# Patient Record
Sex: Male | Born: 1950 | Race: White | Hispanic: No | Marital: Married | Smoking: Never smoker
Health system: Southern US, Community
[De-identification: ages and names within clinical notes are randomized; demographics above are authoritative.]

## PROBLEM LIST (undated history)

## (undated) DIAGNOSIS — K219 Gastro-esophageal reflux disease without esophagitis: Secondary | ICD-10-CM

## (undated) DIAGNOSIS — I639 Cerebral infarction, unspecified: Secondary | ICD-10-CM

## (undated) DIAGNOSIS — G459 Transient cerebral ischemic attack, unspecified: Secondary | ICD-10-CM

## (undated) DIAGNOSIS — I10 Essential (primary) hypertension: Secondary | ICD-10-CM

## (undated) DIAGNOSIS — Q211 Atrial septal defect: Secondary | ICD-10-CM

## (undated) DIAGNOSIS — Q2112 Patent foramen ovale: Secondary | ICD-10-CM

## (undated) HISTORY — DX: Patent foramen ovale: Q21.12

## (undated) HISTORY — DX: Essential (primary) hypertension: I10

## (undated) HISTORY — PX: SHOULDER ARTHROSCOPY: SHX128

## (undated) HISTORY — DX: Transient cerebral ischemic attack, unspecified: G45.9

## (undated) HISTORY — DX: Atrial septal defect: Q21.1

---

## 2002-04-29 ENCOUNTER — Encounter: Payer: Self-pay | Admitting: Family Medicine

## 2002-04-29 ENCOUNTER — Encounter: Admission: RE | Admit: 2002-04-29 | Discharge: 2002-04-29 | Payer: Self-pay | Admitting: Family Medicine

## 2004-11-13 ENCOUNTER — Emergency Department (HOSPITAL_COMMUNITY): Admission: EM | Admit: 2004-11-13 | Discharge: 2004-11-13 | Payer: Self-pay | Admitting: Emergency Medicine

## 2005-01-17 ENCOUNTER — Observation Stay (HOSPITAL_COMMUNITY): Admission: AD | Admit: 2005-01-17 | Discharge: 2005-01-18 | Payer: Self-pay | Admitting: *Deleted

## 2005-01-24 ENCOUNTER — Ambulatory Visit (HOSPITAL_COMMUNITY): Admission: RE | Admit: 2005-01-24 | Discharge: 2005-01-24 | Payer: Self-pay | Admitting: *Deleted

## 2005-02-11 ENCOUNTER — Ambulatory Visit (HOSPITAL_COMMUNITY): Admission: RE | Admit: 2005-02-11 | Discharge: 2005-02-12 | Payer: Self-pay | Admitting: Cardiology

## 2005-02-11 HISTORY — PX: PATENT FORAMEN OVALE CLOSURE: SHX2181

## 2005-02-11 HISTORY — PX: TEE WITHOUT CARDIOVERSION: SHX5443

## 2008-03-24 ENCOUNTER — Observation Stay (HOSPITAL_COMMUNITY): Admission: EM | Admit: 2008-03-24 | Discharge: 2008-03-24 | Payer: Self-pay | Admitting: Emergency Medicine

## 2008-04-01 HISTORY — PX: NM MYOCAR PERF WALL MOTION: HXRAD629

## 2010-01-26 ENCOUNTER — Encounter: Admission: RE | Admit: 2010-01-26 | Discharge: 2010-01-26 | Payer: Self-pay | Admitting: Orthopaedic Surgery

## 2010-03-28 ENCOUNTER — Encounter: Payer: Self-pay | Admitting: Orthopaedic Surgery

## 2010-06-21 LAB — LIPID PANEL
HDL: 36 mg/dL — ABNORMAL LOW (ref >39)
LDL Cholesterol: 122 mg/dL — ABNORMAL HIGH (ref 0–99)
Triglycerides: 121 mg/dL (ref <150)

## 2010-06-21 LAB — CBC
HCT: 44.1 % (ref 39.0–52.0)
Hemoglobin: 15.3 g/dL (ref 13.0–17.0)
WBC: 8.8 10*3/uL (ref 4.0–10.5)

## 2010-06-21 LAB — D-DIMER, QUANTITATIVE: D-Dimer, Quant: 0.24 ug/mL-FEU (ref 0.00–0.48)

## 2010-06-21 LAB — DIFFERENTIAL
Eosinophils Relative: 1 % (ref 0–5)
Lymphocytes Relative: 21 % (ref 12–46)
Lymphs Abs: 1.9 10*3/uL (ref 0.7–4.0)
Monocytes Absolute: 0.5 10*3/uL (ref 0.1–1.0)

## 2010-06-21 LAB — POCT CARDIAC MARKERS
CKMB, poc: <1 ng/mL — ABNORMAL LOW (ref 1.0–8.0)
CKMB, poc: <1 ng/mL — ABNORMAL LOW (ref 1.0–8.0)
Myoglobin, poc: 56.3 ng/mL (ref 12–200)
Myoglobin, poc: 63.5 ng/mL (ref 12–200)
Troponin i, poc: <0.05 ng/mL (ref 0.00–0.09)

## 2010-06-21 LAB — CK TOTAL AND CKMB (NOT AT ARMC): Relative Index: 0.6 (ref 0.0–2.5)

## 2010-06-21 LAB — BASIC METABOLIC PANEL
GFR calc Af Amer: >60 mL/min (ref >60)
GFR calc non Af Amer: >60 mL/min (ref >60)
Potassium: 4 mEq/L (ref 3.5–5.1)
Sodium: 137 mEq/L (ref 135–145)

## 2010-06-21 LAB — TROPONIN I: Troponin I: 0.03 ng/mL (ref 0.00–0.06)

## 2010-06-21 LAB — CARDIAC PANEL(CRET KIN+CKTOT+MB+TROPI): Relative Index: 1 (ref 0.0–2.5)

## 2010-07-20 NOTE — Discharge Summary (Signed)
NAMEJERETT, ODONOHUE NO.:  [id number]   MEDICAL RECORD NO.:  [id number]          PATIENT TYPE:  OBV   LOCATION:  3731                         FACILITY:  [facility]   PHYSICIAN:  Madaline Savage, M.D.DATE OF BIRTH:  April 01, 1950   DATE OF ADMISSION:  03/24/2008  DATE OF DISCHARGE:  03/24/2008                               DISCHARGE SUMMARY   Mr. Sean Welch came into the emergency department with complaints  about a fullness and indigestion in his epigastric area with increased  belching that relieved discomfort.  He stated he had that episodes 40-50  times in 1 hour associated with diaphoresis and tingling in bilateral  hands.  No shortness of breath, no nausea or vomiting.  He works in the  [employer].  His blood pressure was 170/80.  The discomfort  persisted, so IV Protonix was given.  He had a similar episode 4 days  ago.  He was admitted and he ruled out for an MI.  He was seen by Dr.  Elsie Lincoln who thought it was all gastrointestinal and he was considered  stable for discharge home to have outpatient Myoview done and also a  referral to Gastroenterology or to see his primary care doctor.  He was  put on Protonix.  He does have a history of large PFO status post  closure in December 2006.  His last Cardiolite was March 22, 2004.   LABORATORY DATA:  Lipid profile, total cholesterol 182, triglycerides  121, HDL 36, and LDL 122.  D-dimer was 0.24.  Sodium 137, potassium 4.0,  BUN 18, creatinine 1.12, and glucose 121.  His hemoglobin 15.3,  hematocrit 44.1, WBCs 8.8, and platelets 250.  Two point-of-care markers  were negative, and two CK-MBs and troponins were negative.  Chest x-ray  showed no acute cardiopulmonary process.  He also had a CT of his head  without contrast, which showed a questionable tiny old lacunar infarct  and left parietal deep white matter.  No acute intracranial  abnormalities.   DISCHARGE DIAGNOSES:  1. Epigastric discomfort.  2.  No known coronary artery disease or positive cardiac risk factors.  3. Mild elevated cholesterol.  4. Hypertension.  5. History of large patent foramen ovale status post closure in      December 2006.  6. Prior history of transient ischemic attack.  7. Recent onychomycosis, finishing a 53-month period of Lamisil.  8. Abnormal EKG.  He has some inferior T-wave inversions which are      old.   His discharge medications are aspirin 81 mg a day, multivitamin every  day, fish oil daily, Lamisil 250 mg daily, and Lotensin 20 mg daily.  He  should start Protonix 40 mg a day.  Our office will call him for an  appointment for a stress test, stress Myoview and then follow up with  Dr. Jacinto Halim and he was directed to go to his primary care doctor or  gastroenterologist.      Lezlie Octave, N.P.    ______________________________  Madaline Savage, M.D.    BB/MEDQ  D:  03/24/2008  T:  03/25/2008  Job:  [id number]   cc:   [facility]

## 2010-07-23 NOTE — Consult Note (Signed)
[name], Welch NO.:  [id number]   MEDICAL RECORD NO.:  [id number]          PATIENT TYPE:  INP   LOCATION:  2024                         FACILITY:  [facility]   PHYSICIAN:  Sean Welch, M.D.  DATE OF BIRTH:  08/29/1950   DATE OF CONSULTATION:  01/17/2005  DATE OF DISCHARGE:  01/18/2005                                   CONSULTATION   HISTORY OF PRESENT ILLNESS:  Sean Welch is a 60 year old right-handed  white male born 1950-09-30, with a history of an event that occurred  on January 15, 2005, while at home.  Patient had an episode of transient  right face, arm and leg numbness that lasted only a couple of minutes with  full resolution.  Patient had no associated headache, vision changes, speech  change with weakness or gait disturbance.  Patient noted that the next  morning he had a little bit of soreness in the right neck and thinks that it  is gone at the time of this admission.  Patient was seen by Dr. Jenne Welch and  was set up for further evaluation.  Patient denies any chest pain,  palpitations.  Patient has not had similar events as above.  Patient denied  any headache with the above event.   PAST MEDICAL HISTORY:  1.  History of transient right sensory deficit, possible TIA.  2.  Right ankle fracture in the past.  3.  Right knee arthroscopic surgery x3.   MEDICATIONS PRIOR TO ADMISSION:  Patient was on no medications prior to  admission.   He does not smoke or drink.   SOCIAL HISTORY:  Patient is married. Lives in the [place], [place], area. He has two children who are alive and well.  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   FAMILY HISTORY:  Mother is alive with history of cancer, MI in the past.  Father is alive and well.  Two sisters are alive and well.   REVIEW OF SYSTEMS:  No recent fever or chills.  Patient denies any problems  with headache, speech problems, swallowing  problems, shortness of breath,  chest pain, cough, abdominal pain, nausea or vomiting, trouble controlling  the bowel or bladder, blackout episodes, vertigo.   PHYSICAL EXAMINATION:  GENERAL APPEARANCE:  This patient is a fairly well-  developed white male who is alert and cooperative at the time of  examination.  VITAL SIGNS:  Blood pressure 135/89, heart rate 83, respiratory rate 20,  temperature afebrile.  HEENT:  Head is atraumatic.  Eyes:  Pupils are equal, round and reactive to  light.  Discs are flat bilaterally.  NECK:  Supple.  No carotid bruits noted.  RESPIRATORY:  Examination is clear.  CARDIOVASCULAR:  Regular rate and rhythm with no obvious murmurs or rubs  noted.  EXTREMITIES:  Without significant edema.  NEUROLOGIC:  Cranial nerves as above.  Facial symmetry is present.  Patient  has good sensation to face with pinprick and soft touch bilaterally.  He has  good strength of facial muscles and muscles to head turning  and shoulder  shrug bilaterally.  Speech is well enunciated and not aphasic.  Motor  testing reveals 5/5 strength in all fours.  Good symmetric motor tone is  noted throughout.  Sensory testing is intact to pinprick, soft touch,  vibratory sensation throughout.  Patient has fair finger-nose-finger, heel-  to-shin, normal tandem gait, normal Romberg, negative no evidence of  pronator drift .  Deep tendon reflexes are symmetric and normal.  Toes are  downgoing bilaterally.   LABORATORY DATA:  Pending at this time.   IMPRESSION:  Transient right-sided numbness, probable transient ischemic  attack event.   This patient is being admitted at this point for evaluation of above  episode.  Patient will require further work-up for transient ischemic attack  event.   PLAN:  1.  Would probably cancel the CT scan of the brain.  2.  MRI scan of the brain in conjunction with MR angiogram with intracranial      and extracranial vessels.  3.  Consider 2-D  echocardiogram .  4.  Aspirin therapy for now.  5.  Check homocystine level, fasting lipid profile.   Will follow patient's clinical course while in house.      Sean Welch, M.D.  Electronically Signed     CKW/MEDQ  D:  01/17/2005  T:  01/18/2005  Job:  [id number]   cc:   Sean Welch. Sean Welch, M.D.  Fax: [phone]

## 2010-07-23 NOTE — Discharge Summary (Signed)
NAMEDEVAUN, HERNANDEZ NO.:  [id number]   MEDICAL RECORD NO.:  [id number]          PATIENT TYPE:  INP   LOCATION:  2024                         FACILITY:  [facility]   PHYSICIAN:  Darlin Priestly, MD  DATE OF BIRTH:  1951-01-27   DATE OF ADMISSION:  01/17/2005  DATE OF DISCHARGE:  01/18/2005                                 DISCHARGE SUMMARY   HISTORY OF THE PRESENT ILLNESS:  Mr. Isham is a 60 year old white married  male patient who came into our office on January 17, 2005 with complaints  of right-sided numbness, some neck and jaw pain, and indigestion.  He was  seen by Dr. Jenne Campus and it was decided that he should be hospitalized to  rule out CVA or/or TIA.   The patient was seen by Dr. Anne Hahn in consultation and an MRI and an MRA  were ordered.  These were both negative.  The patient has been placed  heparin.  On January 18, 2005 he had no symptoms and it was decided that he  should be worked up as an outpatient; thus, he was discharged home to have  follow up tests this week.  He will have a 2-D echo and a Persantine  Cardiolite secondary to his symptoms, and he will have a TEE  as an  outpatient on Monday.  He will then follow up with Dr. Jenne Campus on Tuesday.   LABORATORY DATA:  Hemoglobin 15.2, hematocrit 44.3, platelets 274,000 and  WBC 8.9.  Sodium 140, potassium 3.8. BUN 17 and creatinine 1.3.  CK/MB and  troponins were all negative.  His total cholesterol was 181, his LDL was 95,  HDL was 40 and triglycerides 230; however, this was a nonfasting specimen.   DISCHARGE DIAGNOSES:  1.  Probable transient ischemic attack.  2.  Jaw and neck pain; negative CK/MB and troponins.  3.  Abnormal electrocardiogram with incomplete right bundle branch block and      left anterior fascicular block.  4.  Gastroesophageal reflux disease.  5.  Premature family history of coronary artery disease.   DISCHARGE MEDICATIONS:  1.  Enteric coated aspirin 325 mg once a  day.  2.  Nexium 40 mg once a day.  3.  Toprol XL 25 mg once a day.   FOLLOW UP:  The patient will see Dr. Jenne Campus at 12 P.M. on January 25, 2005.  He will have a 2-D echo at 4 P.M. on January 20, 2005.  He will have  a Cardiolite at 11:15 and he will have a TEE on Monday, January 24, 2005.  He will probably need to see Dr. Anne Hahn again for any further neurological  treatment after his tests are complete.      Lezlie Octave, N.P.      Darlin Priestly, MD  Electronically Signed    BB/MEDQ  D:  01/18/2005  T:  01/19/2005  Job:  [id number]   cc:   Marlan Palau, M.D.  Fax: [phone]   [place]ale Haven. Andrey Campanile, M.D.  Fax: [phone]

## 2010-07-23 NOTE — Cardiovascular Report (Signed)
Sean Welch, Sean Welch             ACCOUNT NO.:  [id number]   MEDICAL RECORD NO.:  [id number]          PATIENT TYPE:  AMB   LOCATION:  ENDO                         FACILITY:  [facility]   PHYSICIAN:  Darlin Priestly, MD  DATE OF BIRTH:  Aug 03, 1950   DATE OF PROCEDURE:  01/24/2005  DATE OF DISCHARGE:                              CARDIAC CATHETERIZATION   PROCEDURE:  Transesophageal echocardiogram.   INDICATIONS:  Mr. Masterson is a 60 year old male with no significant past  medical history who was initially seen in the office on January 17, 2005  after the patient was complaining of some right-sided numbness along his  whole right side of his face, right upper extremity and right lower  extremity, with some temporary loss of function of his right arm.  He was  subsequently admitted to the hospital for rule out TIA which had no  significant findings by head CT and was subsequently seen by neurology, who  felt he had a possible TIA.  He is now referred for transesophageal  echocardiogram to rule out possible PFO.   DESCRIPTION OF OPERATION:  After confirming consent, the patient was brought  to the endoscopy suite in a fasting state.  The patient underwent successful  and uncomplicated transesophageal echocardiogram.   The LV was normal in size and function.  Estimated EF 50-55%.   Structurally normal aortic valve.   Structurally normal mitral valve with mild mitral regurgitation.   Structurally normal tricuspid valve with trivial tricuspid regurgitation.   There was mild pulmonic regurgitation.   There is no evidence of intracardiac mass or thrombus noted.   There is a small PFO noted with right to left, but no left to right shunting  by __________ color and contrast echo.   Normal descending thoracic aorta.   CONCLUSIONS:  Successful transesophageal echocardiogram with findings noted  above.  There does appear to be a small PFO with right to left but no left  to right  shunting by contrast echo.      Darlin Priestly, MD  Electronically Signed     RHM/MEDQ  D:  01/24/2005  T:  01/24/2005  Job:  [id number]   cc:   [place]. Andrey Campanile, M.D.  Fax: [phone]

## 2010-07-23 NOTE — Cardiovascular Report (Signed)
NAMEANTOINNE, SPADACCINI             ACCOUNT NO.:  [id number]   MEDICAL RECORD NO.:  [id number]          PATIENT TYPE:  OIB   LOCATION:  6527                         FACILITY:  [facility]   PHYSICIAN:  Vonna Kotyk R. Jacinto Halim, MD       DATE OF BIRTH:  05/03/1950   DATE OF PROCEDURE:  02/11/2005  DATE OF DISCHARGE:  02/12/2005                              CARDIAC CATHETERIZATION   ATTENDING:  Duffy Rhody C. Andrey Campanile, M.D.   ATTENDING CARDIOLOGIST:  Darlin Priestly, M.D.   NEUROLOGIST:  Marlan Palau, M.D.   PROCEDURE PERFORMED:  1.  Intracardiac echocardiogram.  2.  Sizing of the PFO with PFO sizing balloon.  3.  Closure of the PFO with a 28-mm CardioSEAL septal occluder.   INDICATION:  Mr. Shannen Vernon is a 60 year old Caucasian male with  history of TIA on January 15, 2005 and was found to have a large PFO.  He  underwent a TEE that revealed a large PFO with right to left shunting.  He  underwent transcranial Doppler studies by Dr. 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  As per patient preference, as he has no other etiology  for his TIA and jaundice, he was brought to the catheterization laboratory  for PFO closure.  Other etiologies for his TIA were ruled out.   INTRACARDIAC ECHOCARDIOGRAM DATA:  1.  Left atrium:  The left atrium is normal.  2.  Right atrium:  The right atrium is normal.  3.  Right ventricle:  The right ventricle is normal.  4  Left ventricle:  The left ventricle is normal.  1.  Mitral valve:  The mitral valve is normal.  There is minimal mitral      valve regurgitation.  2.  Tricuspid valve:  The tricuspid valve is normal.  There is mild      tricuspid valve regurgitation.  3.  Aortic valve:  The aortic valve is normal.  4.  Pulmonary artery:  The pulmonary artery is grossly normal.  5.  The root of the aorta was normal.  6.  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  The      PFO was clearly documented by color Doppler  studies.   INTERVENTION DATA:  Successful closure of the PFO with a 28-mm CardioSEAL  septal occluder.  Post closure of the PFO, a double-contrast study  demonstrated no evidence of right to left shunting.  The patient tolerated  the procedure.  No immediate complications were noted.   TECHNIQUE OF THE PROCEDURE:  Under usual sterile precautions using a 9  French left femoral venous access, an intracardiac echo probe was advanced  through the venous access in the left groin, and intracardiac echocardiogram  was performed.  Then after confirming the presence of PFO, a 6 [place]  multipurpose-A2 catheter was advanced into the right atrium, and the PFO was  crossed over with the help of a long Amplatz wire.  Then after positioning  the Amplatz wire in the left upper pulmonary vein, the multipurpose-A2  catheter was withdrawn from the body, and the wire  was kept carefully in  position.  Intravenous heparin was administered during the procedure.  ACT  was maintained at greater that 250.  Then using an 8 French femoral venous  sheath, the PFO sizing balloon was advanced into the PFO.  The PFO size was  measured including the tunnel length. Then the balloon was withdrawn out of  the body.  It was decided to proceed with a 28-mm septal occluder as the  tunnel measured 1 cm and the PFO also measured 1 cm.  Then an 18 French  Mullins sheath was advanced into the left atrium over the Zoar wire and the  left atrial position was confirmed with the intracardiac echo.  Then the  septal occluder was loaded and gently advanced into the left atrial side.  The left atrial side was deployed to appose the interatrial septum, and the  right atrial side was deployed.  The deployment of the device was confirmed  by intracardiac echo.  Post deployment, a double-contrast study was  performed revealing no evidence of right to left shunting.  Then the  catheters were all withdrawn out of the body in the usual  fashion.  The  patient tolerated the procedure.  No immediate complications noted.      Cristy Hilts. Jacinto Halim, MD  Electronically Signed     JRG/MEDQ  D:  02/11/2005  T:  02/12/2005  Job:  [id number]   cc:   [place]. Andrey Campanile, M.D.  Fax: [phone]   Darlin Priestly, MD  Fax: [phone]   C. Lesia Sago, M.D.  Fax: [phone]

## 2011-10-19 HISTORY — PX: US ECHOCARDIOGRAPHY: HXRAD669

## 2012-10-23 ENCOUNTER — Other Ambulatory Visit: Payer: Self-pay | Admitting: *Deleted

## 2012-10-23 MED ORDER — BENAZEPRIL HCL 20 MG PO TABS: 20.0000 mg | ORAL_TABLET | Freq: Every day | ORAL | Status: DC

## 2012-10-31 ENCOUNTER — Telehealth: Payer: Self-pay | Admitting: Cardiovascular Disease

## 2012-10-31 NOTE — Telephone Encounter (Signed)
Pt also verbalized understanding that he will not be able to receive any further refills w/o an appt..  Pt stated he will keep his appt next week.

## 2012-10-31 NOTE — Telephone Encounter (Signed)
Returned call.  Pt informed refill was sent on 8.19.14.  Pt stated he went yesterday and was told they don't have it.  Informed RN will call pharmacy and call him back.  Call to pharmacy and refill received, but pt did not pick up so it was put on hold.    Call to pt and informed.  Pt verbalized understanding and agreed w/ plan.

## 2012-10-31 NOTE — Telephone Encounter (Signed)
Please call-prescription have expired-has an appt on 11-09-12-would like to have enough medicine until his appt.Would you please call his Benazepril 20 mg to [phone].

## 2012-11-04 ENCOUNTER — Encounter: Payer: Self-pay | Admitting: *Deleted

## 2012-11-08 ENCOUNTER — Encounter: Payer: Self-pay | Admitting: Cardiovascular Disease

## 2012-11-09 ENCOUNTER — Encounter: Payer: Self-pay | Admitting: Cardiovascular Disease

## 2012-11-09 ENCOUNTER — Ambulatory Visit (INDEPENDENT_AMBULATORY_CARE_PROVIDER_SITE_OTHER): Payer: PRIVATE HEALTH INSURANCE | Admitting: Cardiovascular Disease

## 2012-11-09 VITALS — BP 100/70 | HR 73 | Ht 70.5 in | Wt 206.6 lb

## 2012-11-09 DIAGNOSIS — Z9889 Other specified postprocedural states: Secondary | ICD-10-CM

## 2012-11-09 DIAGNOSIS — I10 Essential (primary) hypertension: Secondary | ICD-10-CM | POA: Insufficient documentation

## 2012-11-09 DIAGNOSIS — E663 Overweight: Secondary | ICD-10-CM

## 2012-11-09 DIAGNOSIS — Z8774 Personal history of (corrected) congenital malformations of heart and circulatory system: Secondary | ICD-10-CM

## 2012-11-09 MED ORDER — BENAZEPRIL HCL 20 MG PO TABS: 10.0000 mg | ORAL_TABLET | Freq: Every day | ORAL | Status: DC

## 2012-11-09 NOTE — Assessment & Plan Note (Signed)
His blood pressure is borderline low. When he last saw Dr. Thea Silversmith for systolic blood pressure was only 102. Today it is even lower. I've recommended that he reduce the benazepril dose to 10 mg once daily

## 2012-11-09 NOTE — Assessment & Plan Note (Signed)
Marked improvement in his weight. He plans to continue trying to lose weight. He is evidence of a target of 190 pounds. Although this will still leave him in the mildly overweight category, it is important to note that this is exactly what he weighed when he was in ninth grade. He is a fairly muscular and broad shouldered gentleman. I think a weight of 190 pounds might be reasonable.

## 2012-11-09 NOTE — Progress Notes (Signed)
Patient ID: Sean Welch, male   DOB: [birth year], 62 y.o.   MRN: [id number]     Reason for office visit Hypertension, status post ASD closure  Sean Welch just turned 62 years old and states that he feels excellent. He has newfound energy and has lost more than 20 pounds in weight from his maximum weight last year. He weighs 13 pounds less than he did at his office appointment last August. He is eating a low-carb diet and is walking between 2-4 miles a day everyday of the week. He has no cardiac complaints whatsoever. 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 Not had any neurological event since that time. We have been monitoring the occluder on an every other year basis with echocardiography. The study performed last year showed normal findings. He has no cardiac complaints.    No Known Allergies  Current Outpatient Prescriptions  Medication Sig Dispense Refill  . aspirin 81 MG tablet Take 81 mg by mouth daily.      . benazepril (LOTENSIN) 20 MG tablet Take 0.5 tablets (10 mg total) by mouth daily. Needs appointment before anymore refils  90 tablet  3  . Multiple Vitamin (MULTIVITAMIN) tablet Take 1 tablet by mouth daily.      . tamsulosin (FLOMAX) 0.4 MG CAPS capsule Take 0.4 mg by mouth daily.      Marland Kitchen CIALIS 5 MG tablet 5 mg.       No current facility-administered medications for this visit.    Past Medical History  Diagnosis Date  . TIA (transient ischemic attack)   . PFO (patent foramen ovale)     repair 02/2005  . Systemic hypertension     Past Surgical History  Procedure Laterality Date  . Patent foramen ovale closure  02/11/2005  . Tee without cardioversion  02/11/2005  . US echocardiography  10/19/2011    mild concentric LVH,mechanical PFO closure device, MV leaflet valve thickening  . Nm myocar perf wall motion  04/01/2008    Normal    Family History  Problem Relation Age of Onset  . Heart attack  Mother   . Cancer Mother   . Hypertension Mother     History   Social History  . Marital Status: Married    Spouse Name: N/A    Number of Children: N/A  . Years of Education: N/A   Occupational History  . Not on file.   Social History Main Topics  . Smoking status: Never Smoker   . Smokeless tobacco: Not on file  . Alcohol Use: Yes     Comment: couple times a month  . Drug Use: No  . Sexual Activity: Not on file   Other Topics Concern  . Not on file   Social History Narrative  . No narrative on file    Review of systems: The patient specifically denies any chest pain at rest or with exertion, dyspnea at rest or with exertion, orthopnea, paroxysmal nocturnal dyspnea, syncope, palpitations, focal neurological deficits, intermittent claudication, lower extremity edema, unexplained weight gain, cough, hemoptysis or wheezing.  The patient also denies abdominal pain, nausea, vomiting, dysphagia, diarrhea, constipation, polyuria, polydipsia, dysuria, hematuria, frequency, urgency, abnormal bleeding or bruising, fever, chills, unexpected weight changes, mood swings, change in skin or hair texture, change in voice quality, auditory or visual problems, allergic reactions or rashes, new musculoskeletal complaints other than usual "aches and pains".   PHYSICAL EXAM BP  100/70  Pulse 73  Ht 5' 10.5" (1.791 m)  Wt 206 lb 9.6 oz (93.713 kg)  BMI 29.22 kg/m2  General: Alert, oriented x3, no distress Head: no evidence of trauma, PERRL, EOMI, no exophtalmos or lid lag, no myxedema, no xanthelasma; normal ears, nose and oropharynx Neck: normal jugular venous pulsations and no hepatojugular reflux; brisk carotid pulses without delay and no carotid bruits Chest: clear to auscultation, no signs of consolidation by percussion or palpation, normal fremitus, symmetrical and full respiratory excursions Cardiovascular: normal position and quality of the apical impulse, regular rhythm, normal  first and second heart sounds, no murmurs, rubs or gallops Abdomen: no tenderness or distention, no masses by palpation, no abnormal pulsatility or arterial bruits, normal bowel sounds, no hepatosplenomegaly Extremities: no clubbing, cyanosis or edema; 2+ radial, ulnar and brachial pulses bilaterally; 2+ right femoral, posterior tibial and dorsalis pedis pulses; 2+ left femoral, posterior tibial and dorsalis pedis pulses; no subclavian or femoral bruits Neurological: grossly nonfocal   EKG: Normal sinus rhythm, borderline left axis deviation, minor intraventricular conduction delay, no change from previous tracings.  Lipid Panel he states that he recently had laboratory tests including cholesterol levels with Dr. Thea Welch and that all his levels were "excellent".    Component Value Date/Time   CHOL  Value: 182        ATP III CLASSIFICATION:  <200     mg/dL   Desirable  130-865  mg/dL   Borderline High  >=784    mg/dL   High        6/96/2952 0432   TRIG 121 03/24/2008 0432   HDL 36* 03/24/2008 0432   CHOLHDL 5.1 03/24/2008 0432   VLDL 24 03/24/2008 0432   LDLCALC  Value: 122        Total Cholesterol/HDL:CHD Risk Coronary Heart Disease Risk Table                     Men   Women  1/2 Average Risk   3.4   3.3  Average Risk       5.0   4.4  2 X Average Risk   9.6   7.1  3 X Average Risk  23.4   11.0        Use the calculated Patient Ratio above and the CHD Risk Table to determine the patient's CHD Risk.        ATP III CLASSIFICATION (LDL):  <100     mg/dL   Optimal  841-324  mg/dL   Near or Above                    Optimal  130-159  mg/dL   Borderline  401-027  mg/dL   High  >253     mg/dL   Very High* 6/64/4034 0432    BMET    Component Value Date/Time   NA 137 03/24/2008 0028   K 4.0 03/24/2008 0028   CL 106 03/24/2008 0028   CO2 22 03/24/2008 0028   GLUCOSE 121* 03/24/2008 0028   BUN 18 03/24/2008 0028   CREATININE 1.12 03/24/2008 0028   CALCIUM 9.8 03/24/2008 0028   GFRNONAA >60 03/24/2008 0028    GFRAA  Value: >60        The eGFR has been calculated using the MDRD equation. This calculation has not been validated in all clinical situations. eGFR's persistently <60 mL/min signify possible Chronic Kidney Disease. 03/24/2008 0028     ASSESSMENT AND PLAN Overweight  Marked improvement in his weight. He plans to continue trying to lose weight. He is evidence of a target of 190 pounds. Although this will still leave him in the mildly overweight category, it is important to note that this is exactly what he weighed when he was in ninth grade. He is a fairly muscular and broad shouldered gentleman. I think a weight of 190 pounds might be reasonable.  HTN (hypertension) His blood pressure is borderline low. When he last saw Dr. Thea Welch for systolic blood pressure was only 102. Today it is even lower. I've recommended that he reduce the benazepril dose to 10 mg once daily   Orders Placed This Encounter  Procedures  . EKG 12-Lead   Meds ordered this encounter  Medications  . Multiple Vitamin (MULTIVITAMIN) tablet    Sig: Take 1 tablet by mouth daily.  Marland Kitchen CIALIS 5 MG tablet    Sig: 5 mg.  . benazepril (LOTENSIN) 20 MG tablet    Sig: Take 0.5 tablets (10 mg total) by mouth daily. Needs appointment before anymore refils    Dispense:  90 tablet    Refill:  3    Fransheska Willingham  Thurmon Fair, MD, [facility] [phone] office [phone] pager

## 2012-11-09 NOTE — Patient Instructions (Signed)
Your physician has recommended you make the following change in your medication: Reduce Lotensin/benazepril to 10 mg once daily Your physician recommends that you schedule a follow-up appointment in: 1 year

## 2012-11-09 NOTE — Assessment & Plan Note (Signed)
Will reevaluate this next year

## 2012-11-27 ENCOUNTER — Other Ambulatory Visit: Payer: Self-pay | Admitting: Cardiovascular Disease

## 2013-11-20 ENCOUNTER — Ambulatory Visit: Payer: PRIVATE HEALTH INSURANCE | Admitting: Cardiovascular Disease

## 2013-12-23 ENCOUNTER — Ambulatory Visit (INDEPENDENT_AMBULATORY_CARE_PROVIDER_SITE_OTHER): Payer: PRIVATE HEALTH INSURANCE | Admitting: Cardiovascular Disease

## 2013-12-23 ENCOUNTER — Encounter: Payer: Self-pay | Admitting: Cardiovascular Disease

## 2013-12-23 VITALS — BP 110/77 | HR 82 | Resp 16 | Ht 70.0 in | Wt 214.3 lb

## 2013-12-23 DIAGNOSIS — Z8673 Personal history of transient ischemic attack (TIA), and cerebral infarction without residual deficits: Secondary | ICD-10-CM | POA: Insufficient documentation

## 2013-12-23 DIAGNOSIS — I10 Essential (primary) hypertension: Secondary | ICD-10-CM

## 2013-12-23 DIAGNOSIS — Z9889 Other specified postprocedural states: Secondary | ICD-10-CM

## 2013-12-23 DIAGNOSIS — Z8774 Personal history of (corrected) congenital malformations of heart and circulatory system: Secondary | ICD-10-CM

## 2013-12-23 NOTE — Progress Notes (Signed)
Patient ID: Sean Welch, male   DOB: [birth year], 63 y.o.   MRN: [id number]     Reason for office visit Hypertension, history of TIA and PFO closure with percutaneous device  Sean Welch has done well from a cardiac and neurological standpoint since his last appointment a year ago. He has not had any new symptoms to suggest transient ischemic attack or stroke. His PFO was closed with a CardioSEAL device in 2006 and was last evaluated with echocardiography in August 2013. He has well treated hypertension and has struggled with borderline obesity for many years.  Recently he has had even more difficulty maintaining a healthy weight. 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 He has not been exercising at all. He has a weakness for breath in the hospital. He is getting back virtually all the weight that he had lost last year. His body mass index places him in the obese range again.   No Known Allergies  Current Outpatient Prescriptions  Medication Sig Dispense Refill  . aspirin 81 MG tablet Take 81 mg by mouth daily.      . benazepril (LOTENSIN) 20 MG tablet Take 0.5 tablets (10 mg total) by mouth daily. Needs appointment before anymore refils  90 tablet  3  . CIALIS 5 MG tablet 5 mg.      . Multiple Vitamin (MULTIVITAMIN) tablet Take 1 tablet by mouth daily.      . tamsulosin (FLOMAX) 0.4 MG CAPS capsule Take 0.4 mg by mouth daily.       No current facility-administered medications for this visit.    Past Medical History  Diagnosis Date  . TIA (transient ischemic attack)   . PFO (patent foramen ovale)     repair 02/2005  . Systemic hypertension     Past Surgical History  Procedure Laterality Date  . Patent foramen ovale closure  02/11/2005  . Tee without cardioversion  02/11/2005  . US echocardiography  10/19/2011    mild concentric LVH,mechanical PFO closure device, MV leaflet valve thickening  . Nm myocar  perf wall motion  04/01/2008    Normal    Family History  Problem Relation Age of Onset  . Heart attack Mother   . Cancer Mother   . Hypertension Mother     History   Social History  . Marital Status: Married    Spouse Name: N/A    Number of Children: N/A  . Years of Education: N/A   Occupational History  . Not on file.   Social History Main Topics  . Smoking status: Never Smoker   . Smokeless tobacco: Not on file  . Alcohol Use: Yes     Comment: couple times a month  . Drug Use: No  . Sexual Activity: Not on file   Other Topics Concern  . Not on file   Social History Narrative  . No narrative on file    Review of systems: The patient specifically denies any chest pain at rest or with exertion, dyspnea at rest or with exertion, orthopnea, paroxysmal nocturnal dyspnea, syncope, palpitations, focal neurological deficits, intermittent claudication, lower extremity edema, unexplained weight gain, cough, hemoptysis or wheezing.  The patient also denies abdominal pain, nausea, vomiting, dysphagia, diarrhea, constipation, polyuria, polydipsia, dysuria, hematuria, frequency, urgency, abnormal bleeding or bruising, fever, chills, unexpected weight changes, mood swings, change in skin or hair texture, change in voice quality, auditory or visual problems, allergic  reactions or rashes, new musculoskeletal complaints other than usual "aches and pains".   PHYSICAL EXAM BP 110/77  Pulse 82  Resp 16  Ht 5' 10"  (1.778 m)  Wt 97.206 kg (214 lb 4.8 oz)  BMI 30.75 kg/m2  General: Alert, oriented x3, no distress Head: no evidence of trauma, PERRL, EOMI, no exophtalmos or lid lag, no myxedema, no xanthelasma; normal ears, nose and oropharynx Neck: normal jugular venous pulsations and no hepatojugular reflux; brisk carotid pulses without delay and no carotid bruits Chest: clear to auscultation, no signs of consolidation by percussion or palpation, normal fremitus, symmetrical and full  respiratory excursions Cardiovascular: normal position and quality of the apical impulse, regular rhythm, normal first and second heart sounds, no murmurs, rubs or gallops Abdomen: no tenderness or distention, no masses by palpation, no abnormal pulsatility or arterial bruits, normal bowel sounds, no hepatosplenomegaly Extremities: no clubbing, cyanosis or edema; 2+ radial, ulnar and brachial pulses bilaterally; 2+ right femoral, posterior tibial and dorsalis pedis pulses; 2+ left femoral, posterior tibial and dorsalis pedis pulses; no subclavian or femoral bruits Neurological: grossly nonfocal   EKG: NSR, normal   Lipid Panel     Component Value Date/Time   CHOL  Value: 182        ATP III CLASSIFICATION:  <200     mg/dL   Desirable  200-239  mg/dL   Borderline High  >=240    mg/dL   High        03/24/2008 0432   TRIG 121 03/24/2008 0432   HDL 36* 03/24/2008 0432   CHOLHDL 5.1 03/24/2008 0432   VLDL 24 03/24/2008 0432   LDLCALC  Value: 122        Total Cholesterol/HDL:CHD Risk Coronary Heart Disease Risk Table                     Men   Women  1/2 Average Risk   3.4   3.3  Average Risk       5.0   4.4  2 X Average Risk   9.6   7.1  3 X Average Risk  23.4   11.0        Use the calculated Patient Ratio above and the CHD Risk Table to determine the patient's CHD Risk.        ATP III CLASSIFICATION (LDL):  <100     mg/dL   Optimal  100-129  mg/dL   Near or Above                    Optimal  130-159  mg/dL   Borderline  160-189  mg/dL   High  >190     mg/dL   Very High* 03/24/2008 0432    BMET    Component Value Date/Time   NA 137 03/24/2008 0028   K 4.0 03/24/2008 0028   CL 106 03/24/2008 0028   CO2 22 03/24/2008 0028   GLUCOSE 121* 03/24/2008 0028   BUN 18 03/24/2008 0028   CREATININE 1.12 03/24/2008 0028   CALCIUM 9.8 03/24/2008 0028   GFRNONAA >60 03/24/2008 0028   GFRAA  Value: >60        The eGFR has been calculated using the MDRD equation. This calculation has not been validated in all clinical  situations. eGFR's persistently <60 mL/min signify possible Chronic Kidney Disease. 03/24/2008 0028     ASSESSMENT AND PLAN Obesity He expresses renewed interest in weight loss. He promises to lose 20 pounds.  I told him I be satisfied if he reduces his waistline 34 inches. He is a fairly muscular and broad shouldered gentleman. I think a weight of 190 pounds might be reasonable.  HTN (hypertension)  Good blood pressure control History of TIA and status post PFO closure with a CardioSEAL device Reevaluate echocardiogram with saline contrast next year.  Holli Humbles, MD, [place] [phone] office [phone] pager

## 2013-12-23 NOTE — Patient Instructions (Signed)
Your physician encouraged you to lose weight for better health.  Dr. Sallyanne Kuster recommends that you schedule a follow-up appointment in: One year.

## 2014-02-02 ENCOUNTER — Other Ambulatory Visit: Payer: Self-pay | Admitting: Cardiovascular Disease

## 2014-02-03 NOTE — Telephone Encounter (Signed)
Rx was sent to pharmacy electronically. 

## 2014-09-10 ENCOUNTER — Other Ambulatory Visit: Payer: Self-pay | Admitting: Orthopaedic Surgery

## 2014-09-10 DIAGNOSIS — M25521 Pain in right elbow: Secondary | ICD-10-CM

## 2014-09-29 ENCOUNTER — Ambulatory Visit
Admission: RE | Admit: 2014-09-29 | Discharge: 2014-09-29 | Disposition: A | Discharge status: Home / Self Care | Payer: PRIVATE HEALTH INSURANCE | Source: Ambulatory Visit | Attending: Orthopaedic Surgery | Admitting: Orthopaedic Surgery

## 2014-09-29 DIAGNOSIS — M25521 Pain in right elbow: Secondary | ICD-10-CM

## 2014-12-31 ENCOUNTER — Encounter: Payer: Self-pay | Admitting: Cardiovascular Disease

## 2014-12-31 ENCOUNTER — Ambulatory Visit (INDEPENDENT_AMBULATORY_CARE_PROVIDER_SITE_OTHER): Payer: PRIVATE HEALTH INSURANCE | Admitting: Cardiovascular Disease

## 2014-12-31 VITALS — BP 130/72 | HR 80 | Resp 16 | Ht 70.5 in | Wt 207.4 lb

## 2014-12-31 DIAGNOSIS — I10 Essential (primary) hypertension: Secondary | ICD-10-CM | POA: Diagnosis not present

## 2014-12-31 DIAGNOSIS — Z9889 Other specified postprocedural states: Secondary | ICD-10-CM | POA: Diagnosis not present

## 2014-12-31 DIAGNOSIS — Z8774 Personal history of (corrected) congenital malformations of heart and circulatory system: Secondary | ICD-10-CM | POA: Diagnosis not present

## 2014-12-31 DIAGNOSIS — Z8673 Personal history of transient ischemic attack (TIA), and cerebral infarction without residual deficits: Secondary | ICD-10-CM | POA: Diagnosis not present

## 2014-12-31 NOTE — Progress Notes (Signed)
Patient ID: Sean Welch, male   DOB: 1950/12/04, 64 y.o.   MRN: [id number]      Cardiology Office Note   Date:  12/31/2014   ID:  AADAM ZHEN, DOB 1951/02/01, MRN [id number]  PCP:  Thressa Sheller, MD  Cardiologist:   Sanda Klein, MD   Chief Complaint  Patient presents with  . Annual Exam    No complaints      History of Present Illness: ABRAN GAVIGAN is a 64 y.o. male who presents for  Follow-up for essential hypertension, obesity and history of transient ischemic attack status post device closure of a small atrial septal defect/patent foramen ovale. (2006 72mm CardioSeal)  He has had a good year from a cardiovascular standpoint , although activity level was limited following a rupture of his right biceps which has healed slowly. He denies new neurological events and does not have edema, shortness of breath, angina , intermittent claudication or other cardiovascular complaints. He is compliant with his medications.   He has successfully lost a little bit of weight and is no longer formally in the obese range, although he remains severely overweight. He intends to continue losing weight.    Past Medical History  Diagnosis Date  . TIA (transient ischemic attack)   . PFO (patent foramen ovale)     repair 02/2005  . Systemic hypertension     Past Surgical History  Procedure Laterality Date  . Patent foramen ovale closure  02/11/2005  . Tee without cardioversion  02/11/2005  . US echocardiography  10/19/2011    mild concentric LVH,mechanical PFO closure device, MV leaflet valve thickening  . Nm myocar perf wall motion  04/01/2008    Normal     Current Outpatient Prescriptions  Medication Sig Dispense Refill  . aspirin 81 MG tablet Take 81 mg by mouth daily.    . benazepril (LOTENSIN) 20 MG tablet Take 0.5 tablets (10 mg total) by mouth daily. 90 tablet 1  . CIALIS 5 MG tablet 5 mg.    . Multiple Vitamin (MULTIVITAMIN) tablet Take 1 tablet by mouth daily.      . tamsulosin (FLOMAX) 0.4 MG CAPS capsule Take 0.4 mg by mouth daily.     No current facility-administered medications for this visit.    Allergies:   Review of patient's allergies indicates no known allergies.    Social History:  The patient  reports that he has never smoked. He does not have any smokeless tobacco history on file. He reports that he drinks alcohol. He reports that he does not use illicit drugs.   Family History:  The patient's family history includes Cancer in his mother; Heart attack in his mother; Hypertension in his mother.    ROS:  Please see the history of present illness.    Otherwise, review of systems positive for none.   All other systems are reviewed and negative.    PHYSICAL EXAM: VS:  BP 130/72 mmHg  Pulse 80  Resp 16  Ht 5' 10.5" (1.791 m)  Wt 207 lb 6.4 oz (94.076 kg)  BMI 29.33 kg/m2 , BMI Body mass index is 29.33 kg/(m^2).  General: Alert, oriented x3, no distress Head: no evidence of trauma, PERRL, EOMI, no exophtalmos or lid lag, no myxedema, no xanthelasma; normal ears, nose and oropharynx Neck: normal jugular venous pulsations and no hepatojugular reflux; brisk carotid pulses without delay and no carotid bruits Chest: clear to auscultation, no signs of consolidation by percussion or palpation, normal fremitus, symmetrical and full  respiratory excursions Cardiovascular: normal position and quality of the apical impulse, regular rhythm, normal first and second heart sounds, no \ murmurs, rubs or gallops Abdomen: no tenderness or distention, no masses by palpation, no abnormal pulsatility or arterial bruits, normal bowel sounds, no hepatosplenomegaly Extremities: no clubbing, cyanosis or edema; 2+ radial, ulnar and brachial pulses bilaterally; 2+ right femoral, posterior tibial and dorsalis pedis pulses; 2+ left femoral, posterior tibial and dorsalis pedis pulses; no subclavian or femoral bruits Neurological: grossly nonfocal Psych: euthymic  mood, full affect   EKG:  EKG is ordered today. 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    Lipid Panel    Component Value Date/Time   CHOL  03/24/2008 0432    182        ATP III CLASSIFICATION:  <200     mg/dL   Desirable  200-239  mg/dL   Borderline High  >=240    mg/dL   High          TRIG 121 03/24/2008 0432   HDL 36* 03/24/2008 0432   CHOLHDL 5.1 03/24/2008 0432   VLDL 24 03/24/2008 0432   LDLCALC * 03/24/2008 0432    122        Total Cholesterol/HDL:CHD Risk Coronary Heart Disease Risk Table                     Men   Women  1/2 Average Risk   3.4   3.3  Average Risk       5.0   4.4  2 X Average Risk   9.6   7.1  3 X Average Risk  23.4   11.0        Use the calculated Patient Ratio above and the CHD Risk Table to determine the patient's CHD Risk.        ATP III CLASSIFICATION (LDL):  <100     mg/dL   Optimal  100-129  mg/dL   Near or Above                    Optimal  130-159  mg/dL   Borderline  160-189  mg/dL   High  >190     mg/dL   Very High      Wt Readings from Last 3 Encounters:  12/31/14 207 lb 6.4 oz (94.076 kg)  12/23/13 214 lb 4.8 oz (97.206 kg)  11/09/12 206 lb 9.6 oz (93.713 kg)     .   ASSESSMENT AND PLAN:  1. S/P ASD occluder for TIA -  No evidence of device malfunction. Repeat echo assessment with a "bubble study" next year. 2.  Essential hypertension, well controlled 3.  Overweight, slowly losing weight.  Encourage persistence  Towards this goal 4. LAFB    Current medicines are reviewed at length with the patient today.  The patient does not have concerns regarding medicines.  The following changes have been made:  no change  Labs/ tests ordered today include:  Orders Placed This Encounter  Procedures  . EKG 12-Lead   Patient Instructions  Dr. Sallyanne Kuster recommends that you schedule a  follow-up appointment in: ONE YEAR       SignedSanda Klein, MD  12/31/2014 3:32 PM    Sanda Klein, MD, [facility] [phone] office [phone] pager

## 2014-12-31 NOTE — Patient Instructions (Signed)
Dr. Croitoru recommends that you schedule a follow-up appointment in: ONE YEAR   

## 2015-02-01 ENCOUNTER — Other Ambulatory Visit: Payer: Self-pay | Admitting: Cardiovascular Disease

## 2015-02-02 NOTE — Telephone Encounter (Signed)
Rx(s) sent to pharmacy electronically.  

## 2015-12-31 ENCOUNTER — Ambulatory Visit: Payer: PRIVATE HEALTH INSURANCE | Admitting: Cardiovascular Disease

## 2016-02-01 ENCOUNTER — Encounter: Payer: Self-pay | Admitting: *Deleted

## 2016-02-03 ENCOUNTER — Ambulatory Visit (INDEPENDENT_AMBULATORY_CARE_PROVIDER_SITE_OTHER): Payer: Commercial Managed Care - PPO | Admitting: Cardiovascular Disease

## 2016-02-03 ENCOUNTER — Encounter: Payer: Self-pay | Admitting: Cardiovascular Disease

## 2016-02-03 VITALS — BP 120/70 | HR 85 | Ht 70.5 in | Wt 212.6 lb

## 2016-02-03 DIAGNOSIS — Z9889 Other specified postprocedural states: Secondary | ICD-10-CM

## 2016-02-03 DIAGNOSIS — I10 Essential (primary) hypertension: Secondary | ICD-10-CM

## 2016-02-03 DIAGNOSIS — E663 Overweight: Secondary | ICD-10-CM

## 2016-02-03 DIAGNOSIS — Z8774 Personal history of (corrected) congenital malformations of heart and circulatory system: Secondary | ICD-10-CM

## 2016-02-03 NOTE — Progress Notes (Signed)
Cardiology Office Note    Date:  02/03/2016   ID:  Sean Welch, DOB Feb 04, 1951, MRN [id number]  PCP:  Sean Sheller, MD  Cardiologist:   Sanda Klein, MD   No chief complaint on file.   History of Present Illness:  Sean Welch is a 65 y.o. 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 His wife Sean Welch is also my patient.  From a physical standpoint he has done great since his last appointment. He had lost down to 196 pounds and was  able to fit into 34 inch jeans, but recently he has not been able to pay attention to his diet or exercise. His waistline is back up to 36 inches. His most important employee, who was worked for him for 20 years has had a stroke and he has had to pick up the additional workload. He has gained back special weight so that he now weighs 5 pounds more than he did a year ago. He has not had problems with dyspnea, angina, palpitations, neurological complaints, leg edema or claudication. His blood pressure remains well controlled.  Past Medical History:  Diagnosis Date  . PFO (patent foramen ovale)    repair 02/2005  . Systemic hypertension   . TIA (transient ischemic attack)     Past Surgical History:  Procedure Laterality Date  . NM MYOCAR PERF WALL MOTION  04/01/2008   Normal  . PATENT FORAMEN OVALE CLOSURE  02/11/2005  . TEE WITHOUT CARDIOVERSION  02/11/2005  . US ECHOCARDIOGRAPHY  10/19/2011   mild concentric LVH,mechanical PFO closure device, MV leaflet valve thickening    Current Medications: Outpatient Medications Prior to Visit  Medication Sig Dispense Refill  . aspirin 81 MG tablet Take 81 mg by mouth daily.    . benazepril (LOTENSIN) 20 MG tablet TAKE 0.5 TABLETS (10 MG TOTAL) BY MOUTH DAILY. 90 tablet 2  . Multiple Vitamin (MULTIVITAMIN) tablet Take 1 tablet by mouth daily.    . tamsulosin  (FLOMAX) 0.4 MG CAPS capsule Take 0.4 mg by mouth daily.    Marland Kitchen CIALIS 5 MG tablet 5 mg.     No facility-administered medications prior to visit.      Allergies:   Patient has no known allergies.   Social History   Social History  . Marital status: Married    Spouse name: N/A  . Number of children: N/A  . Years of education: N/A   Social History Main Topics  . Smoking status: Never Smoker  . Smokeless tobacco: Never Used  . Alcohol use Yes     Comment: couple times a month  . Drug use: No  . Sexual activity: Not Asked   Other Topics Concern  . None   Social History Narrative  . None     Family History:  The patient's family history includes Cancer in his mother; Healthy in his child, child, father, sister, and sister; Heart attack in his mother; Hypertension in his mother.   ROS:   Please see the history of present illness.    ROS All other systems reviewed and are negative.   PHYSICAL EXAM:   VS:  BP 120/70 (BP Location: Right Arm, Patient Position: Sitting, Cuff Size: Normal)   Pulse 85   Ht 5' 10.5" (1.791 m)   Wt 212 lb 9.6 oz (96.4 kg)   BMI 30.07 kg/m    GEN: Well nourished,  well developed, in no acute distress  HEENT: normal  Neck: no JVD, carotid bruits, or masses Cardiac: RRR; no murmurs, rubs, or gallops,no edema  Respiratory:  clear to auscultation bilaterally, normal work of breathing GI: soft, nontender, nondistended, + BS MS: no deformity or atrophy  Skin: warm and dry, no rash Neuro:  Alert and Oriented x 3, Strength and sensation are intact Psych: euthymic mood, full affect  Wt Readings from Last 3 Encounters:  02/03/16 212 lb 9.6 oz (96.4 kg)  12/31/14 207 lb 6.4 oz (94.1 kg)  12/23/13 214 lb 4.8 oz (97.2 kg)      Studies/Labs Reviewed:   EKG:  EKG is ordered today.  The ekg ordered today demonstrates Normal sinus rhythm, left anterior fascicular block, QTC 428 ms, no repolarization changes  Recent Labs: No results found for requested  labs within last 8760 hours.   Lipid Panel    Component Value Date/Time   CHOL  03/24/2008 0432    182        ATP III CLASSIFICATION:  <200     mg/dL   Desirable  200-239  mg/dL   Borderline High  >=240    mg/dL   High          TRIG 121 03/24/2008 0432   HDL 36 (L) 03/24/2008 0432   CHOLHDL 5.1 03/24/2008 0432   VLDL 24 03/24/2008 0432   LDLCALC (H) 03/24/2008 0432    122        Total Cholesterol/HDL:CHD Risk Coronary Heart Disease Risk Table                     Men   Women  1/2 Average Risk   3.4   3.3  Average Risk       5.0   4.4  2 X Average Risk   9.6   7.1  3 X Average Risk  23.4   11.0        Use the calculated Patient Ratio above and the CHD Risk Table to determine the patient's CHD Risk.        ATP III CLASSIFICATION (LDL):  <100     mg/dL   Optimal  100-129  mg/dL   Near or Above                    Optimal  130-159  mg/dL   Borderline  160-189  mg/dL   High  >190     mg/dL   Very High     ASSESSMENT:    1. Essential hypertension   2. Overweight   3. Status post device closure of ASD      PLAN:  In order of problems listed above:  1. HTN: Well-controlled despite weight gain 2. Obesity: Again reviewed the importance of caloric restriction and regular exercise to lose weight. 3. ASD closure: Asymptomatic, no abnormalities on physical exam. Had planned reevaluation with an echocardiogram this year, but due to his increased work load we'll delay until next visit.    Medication Adjustments/Labs and Tests Ordered: Current medicines are reviewed at length with the patient today.  Concerns regarding medicines are outlined above.  Medication changes, Labs and Tests ordered today are listed in the Patient Instructions below. Patient Instructions  Dr Sallyanne Kuster recommends that you schedule a follow-up appointment in 12 months. You will receive a reminder letter in the mail two months in advance. If you don't receive a letter, please call our office to schedule  the follow-up appointment.  If you need a refill on your cardiac medications before your next appointment, please call your pharmacy.    Signed, Sanda Klein, MD  02/03/2016 4:18 PM    [facility] [address], [place], [place]  [address] Phone: [phone]; Fax: [phone]

## 2016-02-03 NOTE — Patient Instructions (Signed)
Dr Croitoru recommends that you schedule a follow-up appointment in 12 months. You will receive a reminder letter in the mail two months in advance. If you don't receive a letter, please call our office to schedule the follow-up appointment.  If you need a refill on your cardiac medications before your next appointment, please call your pharmacy. 

## 2016-04-30 ENCOUNTER — Other Ambulatory Visit: Payer: Self-pay | Admitting: Cardiovascular Disease

## 2017-03-06 DIAGNOSIS — G459 Transient cerebral ischemic attack, unspecified: Secondary | ICD-10-CM | POA: Insufficient documentation

## 2017-03-06 DIAGNOSIS — I10 Essential (primary) hypertension: Secondary | ICD-10-CM | POA: Insufficient documentation

## 2017-03-06 DIAGNOSIS — Q211 Atrial septal defect: Secondary | ICD-10-CM | POA: Insufficient documentation

## 2017-03-06 DIAGNOSIS — Q2112 Patent foramen ovale: Secondary | ICD-10-CM | POA: Insufficient documentation

## 2017-03-30 ENCOUNTER — Ambulatory Visit: Payer: Commercial Managed Care - PPO | Admitting: Cardiovascular Disease

## 2017-03-30 ENCOUNTER — Encounter: Payer: Self-pay | Admitting: Cardiovascular Disease

## 2017-03-30 VITALS — BP 118/80 | HR 84 | Ht 70.5 in | Wt 207.8 lb

## 2017-03-30 DIAGNOSIS — Z8774 Personal history of (corrected) congenital malformations of heart and circulatory system: Secondary | ICD-10-CM

## 2017-03-30 DIAGNOSIS — I10 Essential (primary) hypertension: Secondary | ICD-10-CM | POA: Diagnosis not present

## 2017-03-30 DIAGNOSIS — E663 Overweight: Secondary | ICD-10-CM

## 2017-03-30 DIAGNOSIS — I444 Left anterior fascicular block: Secondary | ICD-10-CM | POA: Diagnosis not present

## 2017-03-30 NOTE — Patient Instructions (Signed)
Dr Croitoru recommends that you schedule a follow-up appointment in 12 months. You will receive a reminder letter in the mail two months in advance. If you don't receive a letter, please call our office to schedule the follow-up appointment.  If you need a refill on your cardiac medications before your next appointment, please call your pharmacy. 

## 2017-03-30 NOTE — Progress Notes (Signed)
Cardiology Office Note    Date:  03/31/2017   ID:  Sean Welch, DOB [birth year], MRN [id number]  PCP:  Sean Sheller, MD  Cardiologist:   Sean Klein, MD   Chief Complaint  Patient presents with  . Follow-up    pt reports no complaints    History of Present Illness:  Sean Welch is a 67 y.o. 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 His wife Sean Welch is also my patient.  This is a routine visit for him.  He has had a very busy fall and winter since he is trying to move his aging parents from [place] to [place].  He has paid less attention to diet and exercise.  He has gained back about 10 pounds of weight since last October.  He is still walking about 4 days a week, 3 miles a day.  Last October when he was working hard on losing weight but was also eating a lot of beef jerky snacks he developed a gout for the first time.  He is now taking allopurinol.  He has not had any gout in the last couple of months.  The patient specifically denies any chest pain at rest exertion, dyspnea at rest or with exertion, orthopnea, paroxysmal nocturnal dyspnea, syncope, palpitations, focal neurological deficits, intermittent claudication, lower extremity edema, unexplained weight gain, cough, hemoptysis or wheezing.   Past Medical History:  Diagnosis Date  . PFO (patent foramen ovale)    repair 02/2005  . Systemic hypertension   . TIA (transient ischemic attack)     Past Surgical History:  Procedure Laterality Date  . NM MYOCAR PERF WALL MOTION  04/01/2008   Normal  . PATENT FORAMEN OVALE CLOSURE  02/11/2005  . TEE WITHOUT CARDIOVERSION  02/11/2005  . US ECHOCARDIOGRAPHY  10/19/2011   mild concentric LVH,mechanical PFO closure device, MV leaflet valve thickening    Current Medications: Outpatient Medications Prior to Visit    Medication Sig Dispense Refill  . allopurinol (ZYLOPRIM) 100 MG tablet Take 100 mg by mouth daily.  0  . aspirin 81 MG tablet Take 81 mg by mouth daily.    . benazepril (LOTENSIN) 20 MG tablet TAKE 0.5 TABLETS (10 MG TOTAL) BY MOUTH DAILY. 90 tablet 3  . colchicine 0.6 MG tablet Take 0.6 mg by mouth daily as needed.  2  . Multiple Vitamin (MULTIVITAMIN) tablet Take 1 tablet by mouth daily.    . tadalafil (CIALIS) 5 MG tablet Take 5 mg by mouth daily.  1  . tamsulosin (FLOMAX) 0.4 MG CAPS capsule Take 0.4 mg by mouth daily.     No facility-administered medications prior to visit.      Allergies:   Patient has no known allergies.   Social History   Socioeconomic History  . Marital status: Married    Spouse name: None  . Number of children: None  . Years of education: None  . Highest education level: None  Social Needs  . Financial resource strain: None  . Food insecurity - worry: None  . Food insecurity - inability: None  . Transportation needs - medical: None  . Transportation needs - non-medical: None  Occupational History  . None  Tobacco Use  . Smoking status: Never Smoker  . Smokeless tobacco: Never Used  Substance and Sexual Activity  . Alcohol use: Yes    Comment: couple times a  month  . Drug use: No  . Sexual activity: None  Other Topics Concern  . None  Social History Narrative  . None     Family History:  The patient's family history includes Cancer in his mother; Healthy in his child, child, father, sister, and sister; Heart attack in his mother; Hypertension in his mother.   ROS:   Please see the history of present illness.    ROS All other systems reviewed and are negative.   PHYSICAL EXAM:   VS:  BP 118/80   Pulse 84   Ht 5' 10.5" (1.791 m)   Wt 207 lb 12.8 oz (94.3 kg)   BMI 29.39 kg/m     General: Alert, oriented x3, no distress, he is overweight but also quite muscular.  Overall he appears fit Head: no evidence of trauma, PERRL, EOMI, no  exophtalmos or lid lag, no myxedema, no xanthelasma; normal ears, nose and oropharynx Neck: normal jugular venous pulsations and no hepatojugular reflux; brisk carotid pulses without delay and no carotid bruits Chest: clear to auscultation, no signs of consolidation by percussion or palpation, normal fremitus, symmetrical and full respiratory excursions Cardiovascular: normal position and quality of the apical impulse, regular rhythm, normal first and second heart sounds, no murmurs, rubs or gallops Abdomen: no tenderness or distention, no masses by palpation, no abnormal pulsatility or arterial bruits, normal bowel sounds, no hepatosplenomegaly Extremities: no clubbing, cyanosis or edema; 2+ radial, ulnar and brachial pulses bilaterally; 2+ right femoral, posterior tibial and dorsalis pedis pulses; 2+ left femoral, posterior tibial and dorsalis pedis pulses; no subclavian or femoral bruits Neurological: grossly nonfocal Psych: Normal mood and affect   Wt Readings from Last 3 Encounters:  03/30/17 207 lb 12.8 oz (94.3 kg)  02/03/16 212 lb 9.6 oz (96.4 kg)  12/31/14 207 lb 6.4 oz (94.1 kg)      Studies/Labs Reviewed:   EKG:  EKG is ordered today.  The ekg ordered today demonstrates NSR, LAFB Recent Labs: No results found for requested labs within last 8760 hours.   Lipid Panel    Component Value Date/Time   CHOL  03/24/2008 0432    182        ATP III CLASSIFICATION:  <200     mg/dL   Desirable  200-239  mg/dL   Borderline High  >=240    mg/dL   High          TRIG 121 03/24/2008 0432   HDL 36 (L) 03/24/2008 0432   CHOLHDL 5.1 03/24/2008 0432   VLDL 24 03/24/2008 0432   LDLCALC (H) 03/24/2008 0432    122        Total Cholesterol/HDL:CHD Risk Coronary Heart Disease Risk Table                     Men   Women  1/2 Average Risk   3.4   3.3  Average Risk       5.0   4.4  2 X Average Risk   9.6   7.1  3 X Average Risk  23.4   11.0        Use the calculated Patient Ratio above  and the CHD Risk Table to determine the patient's CHD Risk.        ATP III CLASSIFICATION (LDL):  <100     mg/dL   Optimal  100-129  mg/dL   Near or Above  Optimal  130-159  mg/dL   Borderline  160-189  mg/dL   High  >190     mg/dL   Very High     ASSESSMENT:    1. Essential hypertension   2. Overweight   3. Status post device closure of ASD      PLAN:  In order of problems listed above:  1. HTN: Well-controlled on current regimen. 2. Overweight: Encouraged him to pay more attention to his diet.  Avoid products rich in animal meat.  Drink plenty of fluids when he is actively trying to lose weight, to avoid another gout attack. 3. ASD closure: Asymptomatic, no abnormalities on physical exam.  We had planned to reevaluate with an echo on this visit, but since he is so busy with his parents will put her up for later in the year.    Medication Adjustments/Labs and Tests Ordered: Current medicines are reviewed at length with the patient today.  Concerns regarding medicines are outlined above.  Medication changes, Labs and Tests ordered today are listed in the Patient Instructions below. Patient Instructions  Dr Sallyanne Kuster recommends that you schedule a follow-up appointment in 12 months. You will receive a reminder letter in the mail two months in advance. If you don't receive a letter, please call our office to schedule the follow-up appointment.  If you need a refill on your cardiac medications before your next appointment, please call your pharmacy.`    Signed, Sean Klein, MD  03/31/2017 5:41 PM    [facility] [address], [place], [place]  [address] Phone: [phone]; Fax: [phone]

## 2017-03-31 ENCOUNTER — Encounter: Payer: Self-pay | Admitting: Cardiovascular Disease

## 2017-07-27 ENCOUNTER — Other Ambulatory Visit: Payer: Self-pay | Admitting: Cardiovascular Disease

## 2017-10-28 ENCOUNTER — Other Ambulatory Visit: Payer: Self-pay | Admitting: Cardiovascular Disease

## 2017-12-05 ENCOUNTER — Telehealth (INDEPENDENT_AMBULATORY_CARE_PROVIDER_SITE_OTHER): Payer: Self-pay | Admitting: Orthopaedic Surgery

## 2017-12-05 NOTE — Telephone Encounter (Signed)
Patient called stating "he rolled his left ankle" last Thursday 11/30/17.  Patient was given the number for the [facility] office to be seen tomorrow.  Patient requested a return call to let him know if there was anything he could do until he is seen.

## 2017-12-05 NOTE — Telephone Encounter (Signed)
Ice,crutches if necessary=if I get to the office will see this pm

## 2017-12-05 NOTE — Telephone Encounter (Signed)
Please advise 

## 2017-12-05 NOTE — Telephone Encounter (Signed)
I called patient, patient going to see Dr. Lorre Nick 12/05/17 at 1:30

## 2018-02-21 ENCOUNTER — Ambulatory Visit (INDEPENDENT_AMBULATORY_CARE_PROVIDER_SITE_OTHER): Payer: Self-pay | Admitting: Orthopaedic Surgery

## 2018-02-21 ENCOUNTER — Ambulatory Visit (INDEPENDENT_AMBULATORY_CARE_PROVIDER_SITE_OTHER): Payer: Self-pay

## 2018-02-21 ENCOUNTER — Ambulatory Visit (INDEPENDENT_AMBULATORY_CARE_PROVIDER_SITE_OTHER): Payer: Commercial Managed Care - PPO | Admitting: Orthopaedic Surgery

## 2018-02-21 ENCOUNTER — Encounter (INDEPENDENT_AMBULATORY_CARE_PROVIDER_SITE_OTHER): Payer: Self-pay | Admitting: Orthopaedic Surgery

## 2018-02-21 VITALS — BP 115/85 | HR 81 | Resp 14 | Ht 70.5 in | Wt 213.0 lb

## 2018-02-21 DIAGNOSIS — M25522 Pain in left elbow: Secondary | ICD-10-CM

## 2018-02-21 MED ORDER — LIDOCAINE HCL 1 % IJ SOLN
1.0000 mL | INTRAMUSCULAR | Status: AC | PRN
  Administered 2018-02-21: 1 mL

## 2018-02-21 MED ORDER — METHYLPREDNISOLONE ACETATE 40 MG/ML IJ SUSP
40.0000 mg | INTRAMUSCULAR | Status: AC | PRN
  Administered 2018-02-21: 40 mg

## 2018-02-21 NOTE — Progress Notes (Signed)
Office Visit Note   Patient: Sean Welch           Date of Birth: 07-29-1950           MRN: [id number] Visit Date: 02/21/2018              Requested by: Thressa Sheller, [place], [address] [place], [place] [address] PCP: Thressa Sheller, MD   Assessment & Plan: Visit Diagnoses:  1. Pain in left elbow     Plan: Acute medial epicondylitis left elbow.  Will inject with cortisone and monitor response  Follow-Up Instructions: Return if symptoms worsen or fail to improve.   Orders:  Orders Placed This Encounter  Procedures  . Hand/UE Inj: L elbow  . XR Elbow 2 Views Left   No orders of the defined types were placed in this encounter.     Procedures: Hand/UE Inj: L elbow for medial epicondylitis on 02/21/2018 8:27 AM Medications: 1 mL lidocaine 1 %; 40 mg methylPREDNISolone acetate 40 MG/ML      Clinical Data: No additional findings.   Subjective: Chief Complaint  Patient presents with  . Left Elbow - Pain  . Elbow Pain    Slipped x 3 weeks, limited range of motion, weakness, shooting pain, tenderness  Sonia Side 67 years old and injured his left elbow about 3 weeks ago.  He was getting out of his truck when he slipped and grabbed the truck with his left arm.  He felt something "pop" along the medial aspect of his elbow.  He was hoping that it would simply resolve on its own but he continues to have pain to the point of compromise.  He is having trouble sleeping and gripping objects.  The pain is localized on the medial epicondyles.  No numbness or tingling.  No skin changes or ecchymosis  HPI  Review of Systems  Constitutional: Negative for fatigue.  HENT: Negative for trouble swallowing.   Eyes: Negative for pain.  Respiratory: Negative for shortness of breath.   Cardiovascular: Negative for leg swelling.  Gastrointestinal: Negative for constipation.  Endocrine: Negative for cold intolerance.  Genitourinary: Negative for difficulty urinating.    Musculoskeletal: Negative for joint swelling.  Skin: Negative for rash.  Allergic/Immunologic: Negative for food allergies.  Neurological: Positive for weakness. Negative for numbness.  Hematological: Does not bruise/bleed easily.  Psychiatric/Behavioral: Negative for sleep disturbance.     Objective: Vital Signs: BP 115/85 (BP Location: Right Arm, Patient Position: Sitting, Cuff Size: Normal)   Pulse 81   Resp 14   Ht 5' 10.5" (1.791 m)   Wt 213 lb (96.6 kg)   BMI 30.13 kg/m   Physical Exam Constitutional:      Appearance: He is well-developed.  Eyes:     Pupils: Pupils are equal, round, and reactive to light.  Pulmonary:     Effort: Pulmonary effort is normal.  Skin:    General: Skin is warm and dry.  Neurological:     Mental Status: He is alert and oriented to person, place, and time.  Psychiatric:        Behavior: Behavior normal.     Ortho Exam awake alert and oriented x3.  Comfortable sitting.  Pain is localized to the medial epicondyles of the left elbow.  There might be slight swelling but no ecchymosis or erythema.  No pain over the ulnar nerve.  Neurovascular exam intact.  No pain over the triceps or biceps or laterally  Specialty Comments:  No specialty  comments available.  Imaging: Xr Elbow 2 Views Left  Result Date: 02/21/2018 2 views of the left elbow were obtained.  No acute changes.  No fat pad.  No ectopic calcification.    PMFS History: Patient Active Problem List   Diagnosis Date Noted  . Systemic hypertension   . PFO (patent foramen ovale)   . TIA (transient ischemic attack)   . 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 Status post device closure of ASD 11/09/2012  . HTN (hypertension) 11/09/2012  . Overweight 11/09/2012   Past Medical History:  Diagnosis Date  . PFO (patent foramen ovale)    repair 02/2005  . Systemic hypertension   . TIA (transient ischemic  attack)     Family History  Problem Relation Age of Onset  . Heart attack Mother   . Cancer Mother   . Hypertension Mother   . Healthy Father   . Healthy Sister   . Healthy Sister   . Healthy Child   . Healthy Child     Past Surgical History:  Procedure Laterality Date  . NM MYOCAR PERF WALL MOTION  04/01/2008   Normal  . PATENT FORAMEN OVALE CLOSURE  02/11/2005  . SHOULDER ARTHROSCOPY    . TEE WITHOUT CARDIOVERSION  02/11/2005  . US ECHOCARDIOGRAPHY  10/19/2011   mild concentric LVH,mechanical PFO closure device, MV leaflet valve thickening   Social History   Occupational History  . Not on file  Tobacco Use  . Smoking status: Never Smoker  . Smokeless tobacco: Never Used  Substance and Sexual Activity  . Alcohol use: Yes    Comment: couple times a month  . Drug use: No  . Sexual activity: Not on file

## 2018-03-29 ENCOUNTER — Ambulatory Visit: Payer: PPO | Admitting: Physician Assistant

## 2018-03-29 ENCOUNTER — Encounter: Payer: Self-pay | Admitting: Physician Assistant

## 2018-03-29 VITALS — BP 130/80 | HR 72 | Ht 70.5 in | Wt 212.4 lb

## 2018-03-29 DIAGNOSIS — I10 Essential (primary) hypertension: Secondary | ICD-10-CM

## 2018-03-29 DIAGNOSIS — Q2112 Patent foramen ovale: Secondary | ICD-10-CM

## 2018-03-29 DIAGNOSIS — Z8673 Personal history of transient ischemic attack (TIA), and cerebral infarction without residual deficits: Secondary | ICD-10-CM | POA: Diagnosis not present

## 2018-03-29 DIAGNOSIS — Q211 Atrial septal defect: Secondary | ICD-10-CM

## 2018-03-29 NOTE — Progress Notes (Signed)
Cardiology Office Note    Date:  03/31/2018   ID:  Sean Welch, DOB 26-Aug-1950, MRN [id number]  PCP:  Thressa Sheller, MD (Inactive)  Cardiologist:  Dr. Sallyanne Kuster   Chief Complaint  Patient presents with  . Follow-up    seen for Dr. Sallyanne Kuster    History of Present Illness:  Sean Welch is a 68 y.o. 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  Patient presents back for cardiology office visit.  He is being set up with a new primary care provider Dr. Theda Sers in [place].  He says he just got it fasting lipid panel in November 2019 and the level was good.  Otherwise he is compliant on his medication.  He denies any recent chest pain or shortness of breath.  He had Achilles tendon injury and is finally recovering.  He also injured his elbow recently as well, x-ray of the elbow was negative for fracture.  He was never successful and moving his parents to [place].  He says he is mother diagnosed with colon cancer.  She could not tolerate chemotherapy and he just finished a course of radiation therapy.  His middle daughter who was turning 39 years old and is a mother of 1 just got diagnosed with MS.  He is still dealing with a lot of family issues at this current time.  Otherwise, on physical exam he does not have significant heart murmur.  He does not have any lower extremity edema, orthopnea or PND.  I recommended a nonurgent echocardiogram, however he should focus on dealing with family issues first.  This echocardiogram can be done anytime in the next year.    Past Medical History:  Diagnosis Date  . PFO (patent foramen ovale)    repair 02/2005  . Systemic hypertension   . TIA (transient ischemic attack)     Past Surgical History:  Procedure Laterality Date  . NM MYOCAR PERF WALL MOTION  04/01/2008   Normal  . PATENT FORAMEN OVALE CLOSURE  02/11/2005  . SHOULDER  ARTHROSCOPY    . TEE WITHOUT CARDIOVERSION  02/11/2005  . US ECHOCARDIOGRAPHY  10/19/2011   mild concentric LVH,mechanical PFO closure device, MV leaflet valve thickening    Current Medications: Outpatient Medications Prior to Visit  Medication Sig Dispense Refill  . aspirin 81 MG tablet Take 81 mg by mouth daily.    . benazepril (LOTENSIN) 20 MG tablet TAKE 0.5 TABLETS (10 MG TOTAL) BY MOUTH DAILY. 90 tablet 0  . Multiple Vitamin (MULTIVITAMIN) tablet Take 1 tablet by mouth daily.    . tadalafil (CIALIS) 5 MG tablet Take 5 mg by mouth daily.  1  . tamsulosin (FLOMAX) 0.4 MG CAPS capsule Take 0.4 mg by mouth daily.    Marland Kitchen allopurinol (ZYLOPRIM) 100 MG tablet Take 100 mg by mouth daily.  0  . colchicine 0.6 MG tablet Take 0.6 mg by mouth daily as needed.  2  . benazepril (LOTENSIN) 5 MG tablet Take by mouth.     No facility-administered medications prior to visit.      Allergies:   Patient has no known allergies.   Social History   Socioeconomic History  . Marital status: Married    Spouse name: Not on file  . Number of children: Not on file  . Years of education: Not on file  . Highest education level: Not on file  Occupational  History  . Not on file  Social Needs  . Financial resource strain: Not on file  . Food insecurity:    Worry: Not on file    Inability: Not on file  . Transportation needs:    Medical: Not on file    Non-medical: Not on file  Tobacco Use  . Smoking status: Never Smoker  . Smokeless tobacco: Never Used  Substance and Sexual Activity  . Alcohol use: Yes    Comment: couple times a month  . Drug use: No  . Sexual activity: Not on file  Lifestyle  . Physical activity:    Days per week: Not on file    Minutes per session: Not on file  . Stress: Not on file  Relationships  . Social connections:    Talks on phone: Not on file    Gets together: Not on file    Attends religious service: Not on file    Active member of club or organization: Not on  file    Attends meetings of clubs or organizations: Not on file    Relationship status: Not on file  Other Topics Concern  . Not on file  Social History Narrative  . Not on file     Family History:  The patient's family history includes Cancer in his mother; Healthy in his child, child, father, sister, and sister; Heart attack in his mother; Hypertension in his mother.   ROS:   Please see the history of present illness.    ROS All other systems reviewed and are negative.   PHYSICAL EXAM:   VS:  BP 130/80   Pulse 72   Ht 5' 10.5" (1.791 m)   Wt 212 lb 6.4 oz (96.3 kg)   BMI 30.05 kg/m    GEN: Well nourished, well developed, in no acute distress  HEENT: normal  Neck: no JVD, carotid bruits, or masses Cardiac: RRR; no murmurs, rubs, or gallops,no edema  Respiratory:  clear to auscultation bilaterally, normal work of breathing GI: soft, nontender, nondistended, + BS MS: no deformity or atrophy  Skin: warm and dry, no rash Neuro:  Alert and Oriented x 3, Strength and sensation are intact Psych: euthymic mood, full affect  Wt Readings from Last 3 Encounters:  03/29/18 212 lb 6.4 oz (96.3 kg)  02/21/18 213 lb (96.6 kg)  03/30/17 207 lb 12.8 oz (94.3 kg)      Studies/Labs Reviewed:   EKG:  EKG is ordered today.  The ekg ordered today demonstrates normal sinus rhythm, no significant ST-T wave changes  Recent Labs: No results found for requested labs within last 8760 hours.   Lipid Panel    Component Value Date/Time   CHOL  03/24/2008 0432    182        ATP III CLASSIFICATION:  <200     mg/dL   Desirable  200-239  mg/dL   Borderline High  >=240    mg/dL   High          TRIG 121 03/24/2008 0432   HDL 36 (L) 03/24/2008 0432   CHOLHDL 5.1 03/24/2008 0432   VLDL 24 03/24/2008 0432   LDLCALC (H) 03/24/2008 0432    122        Total Cholesterol/HDL:CHD Risk Coronary Heart Disease Risk Table                     Men   Women  1/2 Average Risk   3.4   3.3  Average Risk        5.0   4.4  2 X Average Risk   9.6   7.1  3 X Average Risk  23.4   11.0        Use the calculated Patient Ratio above and the CHD Risk Table to determine the patient's CHD Risk.        ATP III CLASSIFICATION (LDL):  <100     mg/dL   Optimal  100-129  mg/dL   Near or Above                    Optimal  130-159  mg/dL   Borderline  160-189  mg/dL   High  >190     mg/dL   Very High    Additional studies/ records that were reviewed today include:   Echo 10/21/2011     ASSESSMENT:    1. PFO (patent foramen ovale)   2. Essential hypertension   3. H/O TIA (transient ischemic attack) and stroke      PLAN:  In order of problems listed above:  1. H/o PFO s/p closure device: Patient has been doing well since the procedure.  On physical exam he does not have significant heart murmur.  I will order echocardiogram, this is nonemergent at this time.  Patient is dealing with a lot of family issues recently as his mother is diagnosed with colon cancer and his daughter has been diagnosed with MS.  This echocardiogram can be done anytime in the next year.  2. Hypertension: Blood pressure stable on current therapy  3. History of TIA secondary to paradoxical embolization: PFO has been closed with closure device.    Medication Adjustments/Labs and Tests Ordered: Current medicines are reviewed at length with the patient today.  Concerns regarding medicines are outlined above.  Medication changes, Labs and Tests ordered today are listed in the Patient Instructions below. Patient Instructions  Medication Instructions:  The current medical regimen is effective;  continue present plan and medications.  If you need a refill on your cardiac medications before your next appointment, please call your pharmacy.    Testing/Procedures: Echocardiogram (anytime within the next year) - Your physician has requested that you have an echocardiogram. Echocardiography is a painless test that uses sound  waves to create images of your heart. It provides your doctor with information about the size and shape of your heart and how well your heart's chambers and valves are working. This procedure takes approximately one hour. There are no restrictions for this procedure. This will be performed at our [facility] location - [address], [address].   Follow-Up: At [facility], you and your health needs are our priority.  As part of our continuing mission to provide you with exceptional heart care, we have created designated Provider Care Teams.  These Care Teams include your primary Cardiologist (physician) and Advanced Practice Providers (APPs -  Physician Assistants and Nurse Practitioners) who all work together to provide you with the care you need, when you need it. You will need a follow up appointment in 12 months.  Please call our office 2 months in advance to schedule this appointment.  You may see Sanda Klein, MD or one of the following Advanced Practice Providers on your designated Care Team: [place], [place] . Fabian Sharp, PA-C       Signed, [place], [place]  03/31/2018 11:42 PM    [address] [address], [place], [place]  [address] Phone: [phone]; Fax: [phone]

## 2018-03-29 NOTE — Patient Instructions (Signed)
Medication Instructions:  The current medical regimen is effective;  continue present plan and medications.  If you need a refill on your cardiac medications before your next appointment, please call your pharmacy.    Testing/Procedures: Echocardiogram (anytime within the next year) - Your physician has requested that you have an echocardiogram. Echocardiography is a painless test that uses sound waves to create images of your heart. It provides your doctor with information about the size and shape of your heart and how well your heart's chambers and valves are working. This procedure takes approximately one hour. There are no restrictions for this procedure. This will be performed at our [facility] location - [address], [address].   Follow-Up: At [facility], you and your health needs are our priority.  As part of our continuing mission to provide you with exceptional heart care, we have created designated Provider Care Teams.  These Care Teams include your primary Cardiologist (physician) and Advanced Practice Providers (APPs -  Physician Assistants and Nurse Practitioners) who all work together to provide you with the care you need, when you need it. You will need a follow up appointment in 12 months.  Please call our office 2 months in advance to schedule this appointment.  You may see Sanda Klein, MD or one of the following Advanced Practice Providers on your designated Care Team: [place], [place] . Fabian Sharp, PA-C

## 2018-03-31 ENCOUNTER — Encounter: Payer: Self-pay | Admitting: Physician Assistant

## 2018-04-02 ENCOUNTER — Encounter: Payer: Self-pay | Admitting: Physician Assistant

## 2018-04-02 NOTE — Progress Notes (Signed)
Thanks, please make sure bubble study was ordered with his follow up echo [facility]

## 2018-04-16 ENCOUNTER — Ambulatory Visit (HOSPITAL_COMMUNITY): Payer: PPO | Attending: Cardiology

## 2018-04-16 DIAGNOSIS — I10 Essential (primary) hypertension: Secondary | ICD-10-CM | POA: Diagnosis not present

## 2018-04-16 DIAGNOSIS — Q2112 Patent foramen ovale: Secondary | ICD-10-CM

## 2018-04-16 DIAGNOSIS — Q211 Atrial septal defect: Secondary | ICD-10-CM | POA: Diagnosis not present

## 2018-06-15 ENCOUNTER — Other Ambulatory Visit: Payer: Self-pay

## 2018-06-15 MED ORDER — BENAZEPRIL HCL 20 MG PO TABS: ORAL_TABLET | ORAL | 1 refills | Status: DC

## 2018-07-03 DIAGNOSIS — L570 Actinic keratosis: Secondary | ICD-10-CM | POA: Diagnosis not present

## 2018-07-03 DIAGNOSIS — D225 Melanocytic nevi of trunk: Secondary | ICD-10-CM | POA: Diagnosis not present

## 2018-07-03 DIAGNOSIS — L812 Freckles: Secondary | ICD-10-CM | POA: Diagnosis not present

## 2018-07-03 DIAGNOSIS — L821 Other seborrheic keratosis: Secondary | ICD-10-CM | POA: Diagnosis not present

## 2018-08-07 DIAGNOSIS — J3489 Other specified disorders of nose and nasal sinuses: Secondary | ICD-10-CM | POA: Diagnosis not present

## 2018-10-08 ENCOUNTER — Other Ambulatory Visit: Payer: Self-pay

## 2018-12-07 DIAGNOSIS — Z23 Encounter for immunization: Secondary | ICD-10-CM | POA: Diagnosis not present

## 2018-12-07 DIAGNOSIS — I129 Hypertensive chronic kidney disease with stage 1 through stage 4 chronic kidney disease, or unspecified chronic kidney disease: Secondary | ICD-10-CM | POA: Diagnosis not present

## 2018-12-07 DIAGNOSIS — E785 Hyperlipidemia, unspecified: Secondary | ICD-10-CM | POA: Diagnosis not present

## 2018-12-07 DIAGNOSIS — Z0001 Encounter for general adult medical examination with abnormal findings: Secondary | ICD-10-CM | POA: Diagnosis not present

## 2018-12-07 DIAGNOSIS — N529 Male erectile dysfunction, unspecified: Secondary | ICD-10-CM | POA: Diagnosis not present

## 2018-12-07 DIAGNOSIS — E559 Vitamin D deficiency, unspecified: Secondary | ICD-10-CM | POA: Diagnosis not present

## 2018-12-07 DIAGNOSIS — R7309 Other abnormal glucose: Secondary | ICD-10-CM | POA: Diagnosis not present

## 2018-12-14 DIAGNOSIS — M109 Gout, unspecified: Secondary | ICD-10-CM | POA: Diagnosis not present

## 2018-12-14 DIAGNOSIS — Z0000 Encounter for general adult medical examination without abnormal findings: Secondary | ICD-10-CM | POA: Diagnosis not present

## 2018-12-14 DIAGNOSIS — Z8774 Personal history of (corrected) congenital malformations of heart and circulatory system: Secondary | ICD-10-CM | POA: Diagnosis not present

## 2018-12-14 DIAGNOSIS — Z7189 Other specified counseling: Secondary | ICD-10-CM | POA: Diagnosis not present

## 2018-12-14 DIAGNOSIS — Z23 Encounter for immunization: Secondary | ICD-10-CM | POA: Diagnosis not present

## 2018-12-14 DIAGNOSIS — N529 Male erectile dysfunction, unspecified: Secondary | ICD-10-CM | POA: Diagnosis not present

## 2019-01-08 DIAGNOSIS — D485 Neoplasm of uncertain behavior of skin: Secondary | ICD-10-CM | POA: Diagnosis not present

## 2019-01-08 DIAGNOSIS — L570 Actinic keratosis: Secondary | ICD-10-CM | POA: Diagnosis not present

## 2019-01-30 ENCOUNTER — Other Ambulatory Visit: Payer: Self-pay

## 2019-03-04 ENCOUNTER — Telehealth: Payer: Self-pay | Admitting: Cardiovascular Disease

## 2019-03-04 NOTE — Telephone Encounter (Signed)
Patient states that he has some slight pain in his right shoulder as well as what he thinks is indigestion. States when he burps it makes the indigestion ease up. He is just calling to make sure that what he is experiencing is in fact indigestion and would like to know if he should just contact his primary care doctor.

## 2019-03-04 NOTE — Telephone Encounter (Signed)
Although symptoms are not at all typical for coronary disease, it is hard to be sure on that limited data.  Chest pain radiating into the right shoulder often occurs in gallbladder disease (gallstones).  If his symptoms occur 1 or 2 hours after he eats, especially if he eats fatty foods, would make this hypothesis even more likely. On the other hand if his chest discomfort and shoulder pain worsen when he does physical activity (climbing stairs, walking/running, working in yard, etc.) it would make it more likely heart related. He has not had any formal evaluation for coronary problems since 2010.  I think it is reasonable to set up an appointment for him so we can do an EKG.

## 2019-03-04 NOTE — Telephone Encounter (Signed)
Pt called and reports that he has been having indigestion for the past few weeks and reports associated rt shoulder pain that is only present when he has indigestion. Pt denies any chest pain or SOB. Pt instructed to make his PCP aware and that his symptoms would be referred to Dr. Sallyanne Kuster, and if any further recommendations were made that he would receive a call. Pt verbalized understanding.

## 2019-03-05 NOTE — Telephone Encounter (Signed)
The patient has been set up for a appointment on 1/4 with Dr. Sallyanne Kuster. He has an appointment tomorrow 12/30 with his PCP.

## 2019-03-06 DIAGNOSIS — R142 Eructation: Secondary | ICD-10-CM | POA: Diagnosis not present

## 2019-03-06 DIAGNOSIS — Z8774 Personal history of (corrected) congenital malformations of heart and circulatory system: Secondary | ICD-10-CM | POA: Diagnosis not present

## 2019-03-11 ENCOUNTER — Encounter (INDEPENDENT_AMBULATORY_CARE_PROVIDER_SITE_OTHER): Payer: Self-pay

## 2019-03-11 ENCOUNTER — Other Ambulatory Visit: Payer: Self-pay

## 2019-03-11 ENCOUNTER — Encounter: Payer: Self-pay | Admitting: Cardiovascular Disease

## 2019-03-11 ENCOUNTER — Ambulatory Visit: Payer: PPO | Admitting: Cardiovascular Disease

## 2019-03-11 VITALS — BP 110/70 | HR 72 | Temp 97.2°F | Ht 70.5 in | Wt 211.0 lb

## 2019-03-11 DIAGNOSIS — Z8774 Personal history of (corrected) congenital malformations of heart and circulatory system: Secondary | ICD-10-CM | POA: Diagnosis not present

## 2019-03-11 DIAGNOSIS — R072 Precordial pain: Secondary | ICD-10-CM | POA: Diagnosis not present

## 2019-03-11 DIAGNOSIS — E663 Overweight: Secondary | ICD-10-CM | POA: Diagnosis not present

## 2019-03-11 DIAGNOSIS — E782 Mixed hyperlipidemia: Secondary | ICD-10-CM

## 2019-03-11 DIAGNOSIS — Z8673 Personal history of transient ischemic attack (TIA), and cerebral infarction without residual deficits: Secondary | ICD-10-CM

## 2019-03-11 DIAGNOSIS — I10 Essential (primary) hypertension: Secondary | ICD-10-CM | POA: Diagnosis not present

## 2019-03-11 MED ORDER — METOPROLOL TARTRATE 100 MG PO TABS: ORAL_TABLET | ORAL | 0 refills | Status: DC

## 2019-03-11 NOTE — Patient Instructions (Signed)
Medication Instructions:  No changes  *If you need a refill on your cardiac medications before your next appointment, please call your pharmacy*  Lab Work: Your provider would like for you to return  one week prior to your cardiac ct to have the following labs drawn: BMET. You do not need an appointment for the lab. Once in our office lobby there is a podium where you can sign in and ring the doorbell to alert [place] that you are here. The lab is open from 8:00 am to 4:30 pm; closed for lunch from 12:45pm-1:45pm.  If you have labs (blood work) drawn today and your tests are completely normal, you will receive your results only by: Marland Kitchen MyChart Message (if you have MyChart) OR . A paper copy in the mail If you have any lab test that is abnormal or we need to change your treatment, we will call you to review the results.  Testing/Procedures: Your physician has requested that you have cardiac CT. Cardiac computed tomography (CT) is a painless test that uses an x-ray machine to take clear, detailed pictures of your heart. For further information please visit [facility]. Please follow instruction sheet as given.  Follow-Up: At [facility], you and your health needs are our priority.  As part of our continuing mission to provide you with exceptional heart care, we have created designated Provider Care Teams.  These Care Teams include your primary Cardiologist (physician) and Advanced Practice Providers (APPs -  Physician Assistants and Nurse Practitioners) who all work together to provide you with the care you need, when you need it.  Your next appointment:   12 month(s)  The format for your next appointment:   In Person  Provider:   Sanda Klein, MD  Other Instructions Your cardiac CT will be scheduled at one of the below locations:   [facility] [address] [place], [place] [address] [phone]   If scheduled at [facility], please arrive at the  [facility] main entrance of [facility] 30-45 minutes prior to test start time. Proceed to the [facility] Radiology Department (first floor) to check-in and test prep.  If scheduled at [facility], please arrive 15 mins early for check-in and test prep.  Please follow these instructions carefully (unless otherwise directed):  Hold all erectile dysfunction medications at least 3 days (72 hrs) prior to test.  On the Night Before the Test: . Be sure to Drink plenty of water. . Do not consume any caffeinated/decaffeinated beverages or chocolate 12 hours prior to your test. . Do not take any antihistamines 12 hours prior to your test.  On the Day of the Test: . Drink plenty of water. Do not drink any water within one hour of the test. . Do not eat any food 4 hours prior to the test. . You may take your regular medications prior to the test.  . Take metoprolol (Lopressor) two hours prior to test.   After the Test: . Drink plenty of water. . After receiving IV contrast, you may experience a mild flushed feeling. This is normal. . On occasion, you may experience a mild rash up to 24 hours after the test. This is not dangerous. If this occurs, you can take Benadryl 25 mg and increase your fluid intake. . If you experience trouble breathing, this can be serious. If it is severe call 911 IMMEDIATELY. If it is mild, please call our office. . If you take any  of these medications: Glipizide/Metformin, Avandament, Glucavance, please do not take 48 hours after completing test unless otherwise instructed.   Once we have confirmed authorization from your insurance company, we will call you to set up a date and time for your test.   For non-scheduling related questions, please contact the cardiac imaging nurse navigator should you have any questions/concerns: Marchia Bond, RN Navigator Cardiac Imaging [facility] [phone] Office

## 2019-03-11 NOTE — Progress Notes (Signed)
Cardiology Office Note    Date:  03/13/2019   ID:  [name], DOB [birth year], MRN [id number]  PCP:  Sean Morning, DO  Cardiologist:   Sanda Klein, MD   Chief Complaint  Patient presents with  . Chest Pain    History of Present Illness:  Sean Welch is a 69 y.o. 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 His wife [name] is also my patient.  Over the last 2 or 3 weeks he has developed problems with discomfort in his central chest radiating to his right shoulder and right scapular area, abdominal fullness, all symptoms improved by belching.  The symptoms occur randomly, at rest.  They can be present during activity, but are not worsened by physical activity.  Before the Covid pandemic he was riding his bicycle and walking regularly, but has been less active recently.  He's also gained back some weight, after after having dropped to as low as 199 pounds.  The patient specifically denies any dyspnea at rest or with exertion, orthopnea, paroxysmal nocturnal dyspnea, syncope, palpitations, focal neurological deficits, intermittent claudication, lower extremity edema, unexplained weight gain, cough, hemoptysis or wheezing.  No recent gout attacks.  He has mildly elevated LDL cholesterol of 122.  He has hypertension and a history of TIA.  In the end, his parents never moved to [place] and are still living in their home in [place].    Past Medical History:  Diagnosis Date  . PFO (patent foramen ovale)    repair 02/2005  . Systemic hypertension   . TIA (transient ischemic attack)     Past Surgical History:  Procedure Laterality Date  . NM MYOCAR PERF WALL MOTION  04/01/2008   Normal  . PATENT FORAMEN OVALE CLOSURE  02/11/2005  . SHOULDER ARTHROSCOPY    . TEE WITHOUT CARDIOVERSION  02/11/2005  . US ECHOCARDIOGRAPHY   10/19/2011   mild concentric LVH,mechanical PFO closure device, MV leaflet valve thickening    Current Medications: Outpatient Medications Prior to Visit  Medication Sig Dispense Refill  . allopurinol (ZYLOPRIM) 100 MG tablet Take 100 mg by mouth daily.  0  . aspirin 81 MG tablet Take 81 mg by mouth daily.    . benazepril (LOTENSIN) 20 MG tablet TAKE 0.5 TABLETS (10 MG TOTAL) BY MOUTH DAILY. 90 tablet 1  . colchicine 0.6 MG tablet Take 0.6 mg by mouth daily as needed.  2  . famotidine (PEPCID) 20 MG tablet Take 20 mg by mouth daily.    . Multiple Vitamin (MULTIVITAMIN) tablet Take 1 tablet by mouth daily.    . tadalafil (CIALIS) 5 MG tablet Take 5 mg by mouth daily.  1  . tamsulosin (FLOMAX) 0.4 MG CAPS capsule Take 0.4 mg by mouth daily.     No facility-administered medications prior to visit.     Allergies:   Patient has no known allergies.   Social History   Socioeconomic History  . Marital status: Married    Spouse name: Not on file  . Number of children: Not on file  . Years of education: Not on file  . Highest education level: Not on file  Occupational History  . Not on file  Tobacco Use  . Smoking status: Never Smoker  . Smokeless tobacco: Never Used  Substance and Sexual Activity  . Alcohol use: Yes    Comment: couple times a month  .  Drug use: No  . Sexual activity: Not on file  Other Topics Concern  . Not on file  Social History Narrative  . Not on file   Social Determinants of Health   Financial Resource Strain:   . Difficulty of Paying Living Expenses: Not on file  Food Insecurity:   . Worried About Charity fundraiser in the Last Year: Not on file  . Ran Out of Food in the Last Year: Not on file  Transportation Needs:   . Lack of Transportation (Medical): Not on file  . Lack of Transportation (Non-Medical): Not on file  Physical Activity:   . Days of Exercise per Week: Not on file  . Minutes of Exercise per Session: Not on file  Stress:   .  Feeling of Stress : Not on file  Social Connections:   . Frequency of Communication with Friends and Family: Not on file  . Frequency of Social Gatherings with Friends and Family: Not on file  . Attends Religious Services: Not on file  . Active Member of Clubs or Organizations: Not on file  . Attends Archivist Meetings: Not on file  . Marital Status: Not on file     Family History:  The patient's family history includes Cancer in his mother; Healthy in his child, child, father, sister, and sister; Heart attack in his mother; Hypertension in his mother.   ROS:   Please see the history of present illness.    ROS All other systems are reviewed and are negative.   PHYSICAL EXAM:   VS:  BP 110/70 (BP Location: Left Arm, Patient Position: Sitting, Cuff Size: Large)   Pulse 72   Temp (!) 97.2 F (36.2 C)   Ht 5' 10.5" (1.791 m)   Wt 211 lb (95.7 kg)   BMI 29.85 kg/m     General: Alert, oriented x3, no distress, borderline obese Head: no evidence of trauma, PERRL, EOMI, no exophtalmos or lid lag, no myxedema, no xanthelasma; normal ears, nose and oropharynx Neck: normal jugular venous pulsations and no hepatojugular reflux; brisk carotid pulses without delay and no carotid bruits Chest: clear to auscultation, no signs of consolidation by percussion or palpation, normal fremitus, symmetrical and full respiratory excursions Cardiovascular: normal position and quality of the apical impulse, regular rhythm, normal first and second heart sounds, no murmurs, rubs or gallops Abdomen: no tenderness or distention, no masses by palpation, no abnormal pulsatility or arterial bruits, normal bowel sounds, no hepatosplenomegaly Extremities: no clubbing, cyanosis or edema; 2+ radial, ulnar and brachial pulses bilaterally; 2+ right femoral, posterior tibial and dorsalis pedis pulses; 2+ left femoral, posterior tibial and dorsalis pedis pulses; no subclavian or femoral bruits Neurological:  grossly nonfocal Psych: Normal mood and affect  Wt Readings from Last 3 Encounters:  03/11/19 211 lb (95.7 kg)  03/29/18 212 lb 6.4 oz (96.3 kg)  02/21/18 213 lb (96.6 kg)      Studies/Labs Reviewed:   ECHO 04/16/2018:  1. The left ventricle has normal systolic function of 0000000. The cavity size was normal. There is no increased left ventricular wall thickness. Left ventricular diastolic parameters were indeterminate.  2. No evidence of left ventricular regional wall motion abnormalities.  3. The right ventricle has normal systolic function. The cavity was normal. There is no increase in right ventricular wall thickness.  4. Septal closure device is present. There is no evidence of shunt by color Doppler. Saline contrast study was not performed.  EKG:  EKG is  ordered today.  The ekg ordered today demonstrates NSR, LAFB, rSr' V1.Marland Kitchen Recent Labs: No results found for requested labs within last 8760 hours.  12/07/2018 Cholesterol 194, HDL 45, LDL 122, triglycerides 168 A1c 5.9% Creatinine 1.13, potassium 4.7, normal LFTs, TSH 3.63 Lipid Panel    Component Value Date/Time   CHOL  03/24/2008 0432    182        ATP III CLASSIFICATION:  <200     mg/dL   Desirable  200-239  mg/dL   Borderline High  >=240    mg/dL   High          TRIG 121 03/24/2008 0432   HDL 36 (L) 03/24/2008 0432   CHOLHDL 5.1 03/24/2008 0432   VLDL 24 03/24/2008 0432   LDLCALC (H) 03/24/2008 0432    122        Total Cholesterol/HDL:CHD Risk Coronary Heart Disease Risk Table                     Men   Women  1/2 Average Risk   3.4   3.3  Average Risk       5.0   4.4  2 X Average Risk   9.6   7.1  3 X Average Risk  23.4   11.0        Use the calculated Patient Ratio above and the CHD Risk Table to determine the patient's CHD Risk.        ATP III CLASSIFICATION (LDL):  <100     mg/dL   Optimal  100-129  mg/dL   Near or Above                    Optimal  130-159  mg/dL   Borderline  160-189  mg/dL    High  >190     mg/dL   Very High     ASSESSMENT:    1. Essential hypertension   2. Precordial pain   3. Mixed hyperlipidemia   4. Overweight   5. History of catheter-based closure of atrial septal defect   6. History of TIA (transient ischemic attack)      PLAN:  In order of problems listed above:  1. Chest pain: Some of the features suggest possible GI etiology such as gallbladder disease, but the symptoms are not meal related.  He does have some coronary risk factors and has moderate hypercholesterolemia.  We will check a coronary CT angiogram for stenosis and also to see the overall burden of calcified plaque which might guide [place] towards instituting lipid-lowering therapy.   2. HTN: Fair control. 3. HLP: As below 4. Overweight/borderline obese: A1c is elevated in the range suggesting tendency to diabetes, has mild hypertriglyceridemia and relatively low HDL, all indicative of likely metabolic syndrome and propensity to vascular complications.  Encouraged weight loss and restarting regular physical activity. 5. ASD closure: Asymptomatic, no abnormalities on physical exam.  No evidence for residual shunt on last echo, but saline contrast was not administered.    Medication Adjustments/Labs and Tests Ordered: Current medicines are reviewed at length with the patient today.  Concerns regarding medicines are outlined above.  Medication changes, Labs and Tests ordered today are listed in the Patient Instructions below. Patient Instructions  Medication Instructions:  No changes  *If you need a refill on your cardiac medications before your next appointment, please call your pharmacy*  Lab Work: Your provider would like for you to return  one week prior  to your cardiac ct to have the following labs drawn: BMET. You do not need an appointment for the lab. Once in our office lobby there is a podium where you can sign in and ring the doorbell to alert [place] that you are here. The lab is open  from 8:00 am to 4:30 pm; closed for lunch from 12:45pm-1:45pm.  If you have labs (blood work) drawn today and your tests are completely normal, you will receive your results only by: Marland Kitchen MyChart Message (if you have MyChart) OR . A paper copy in the mail If you have any lab test that is abnormal or we need to change your treatment, we will call you to review the results.  Testing/Procedures: Your physician has requested that you have cardiac CT. Cardiac computed tomography (CT) is a painless test that uses an x-ray machine to take clear, detailed pictures of your heart. For further information please visit [facility]tn. Please follow instruction sheet as given.  Follow-Up: At [facility], you and your health needs are our priority.  As part of our continuing mission to provide you with exceptional heart care, we have created designated Provider Care Teams.  These Care Teams include your primary Cardiologist (physician) and Advanced Practice Providers (APPs -  Physician Assistants and Nurse Practitioners) who all work together to provide you with the care you need, when you need it.  Your next appointment:   12 month(s)  The format for your next appointment:   In Person  Provider:   Sanda Klein, MD  Other Instructions Your cardiac CT will be scheduled at one of the below locations:   [facility] [address] [place], [place] [address] [phone]   If scheduled at [facility], please arrive at the [facility] main entrance of [facility] 30-45 minutes prior to test start time. Proceed to the [facility] Radiology Department (first floor) to check-in and test prep.  If scheduled at [facility], please arrive 15 mins early for check-in and test prep.  Please follow these instructions carefully (unless otherwise directed):  Hold all erectile dysfunction medications at least 3 days (72 hrs) prior to test.  On the  Night Before the Test: . Be sure to Drink plenty of water. . Do not consume any caffeinated/decaffeinated beverages or chocolate 12 hours prior to your test. . Do not take any antihistamines 12 hours prior to your test.  On the Day of the Test: . Drink plenty of water. Do not drink any water within one hour of the test. . Do not eat any food 4 hours prior to the test. . You may take your regular medications prior to the test.  . Take metoprolol (Lopressor) two hours prior to test.   After the Test: . Drink plenty of water. . After receiving IV contrast, you may experience a mild flushed feeling. This is normal. . On occasion, you may experience a mild rash up to 24 hours after the test. This is not dangerous. If this occurs, you can take Benadryl 25 mg and increase your fluid intake. . If you experience trouble breathing, this can be serious. If it is severe call 911 IMMEDIATELY. If it is mild, please call our office. . If you take any of these medications: Glipizide/Metformin, Avandament, Glucavance, please do not take 48 hours after completing test unless otherwise instructed.   Once we have confirmed authorization from your insurance company, we will call you to set up a  date and time for your test.   For non-scheduling related questions, please contact the cardiac imaging nurse navigator should you have any questions/concerns: Marchia Bond, RN Navigator Cardiac Imaging [facility] [phone] Office        Signed, Sanda Klein, MD  03/13/2019 5:24 PM    [address], [place], [place]  [address] Phone: [phone]; Fax: [phone]

## 2019-03-13 ENCOUNTER — Encounter: Payer: Self-pay | Admitting: Cardiovascular Disease

## 2019-03-27 DIAGNOSIS — R072 Precordial pain: Secondary | ICD-10-CM | POA: Diagnosis not present

## 2019-03-27 LAB — BASIC METABOLIC PANEL
BUN/Creatinine Ratio: 17 (ref 10–24)
BUN: 20 mg/dL (ref 8–27)
CO2: 22 mmol/L (ref 20–29)
Calcium: 10.1 mg/dL (ref 8.6–10.2)
Chloride: 104 mmol/L (ref 96–106)
Creatinine, Ser: 1.2 mg/dL (ref 0.76–1.27)
GFR calc Af Amer: 72 mL/min/{1.73_m2} (ref >59)
GFR calc non Af Amer: 63 mL/min/{1.73_m2} (ref >59)
Glucose: 94 mg/dL (ref 65–99)
Potassium: 4.5 mmol/L (ref 3.5–5.2)
Sodium: 140 mmol/L (ref 134–144)

## 2019-04-03 ENCOUNTER — Encounter (HOSPITAL_COMMUNITY): Payer: Self-pay

## 2019-04-03 ENCOUNTER — Telehealth (HOSPITAL_COMMUNITY): Payer: Self-pay | Admitting: Emergency Medicine

## 2019-04-03 NOTE — Telephone Encounter (Signed)
Reaching out to patient to offer assistance regarding upcoming cardiac imaging study; pt verbalizes understanding of appt date/time, parking situation and where to check in, pre-test NPO status and medications ordered, and verified current allergies; name and call back number provided for further questions should they arise Charlot Gouin RN Navigator Cardiac Imaging [facility] [phone] office [phone] cell 

## 2019-04-04 ENCOUNTER — Ambulatory Visit
Admission: RE | Admit: 2019-04-04 | Discharge: 2019-04-04 | Disposition: A | Discharge status: Home / Self Care | Payer: PPO | Source: Ambulatory Visit | Attending: Cardiovascular Disease | Admitting: Cardiovascular Disease

## 2019-04-04 ENCOUNTER — Other Ambulatory Visit: Payer: Self-pay

## 2019-04-04 DIAGNOSIS — R072 Precordial pain: Secondary | ICD-10-CM | POA: Diagnosis not present

## 2019-04-04 DIAGNOSIS — I2510 Atherosclerotic heart disease of native coronary artery without angina pectoris: Secondary | ICD-10-CM | POA: Diagnosis not present

## 2019-04-04 MED ORDER — NITROGLYCERIN 0.4 MG SL SUBL
0.8000 mg | SUBLINGUAL_TABLET | Freq: Once | SUBLINGUAL | Status: AC
  Administered 2019-04-04: 0.8 mg via SUBLINGUAL

## 2019-04-04 MED ORDER — METOPROLOL TARTRATE 5 MG/5ML IV SOLN
5.0000 mg | INTRAVENOUS | Status: DC | PRN
  Administered 2019-04-04: 5 mg via INTRAVENOUS

## 2019-04-04 MED ORDER — IOHEXOL 350 MG/ML SOLN
100.0000 mL | Freq: Once | INTRAVENOUS | Status: AC | PRN
  Administered 2019-04-04: 100 mL via INTRAVENOUS

## 2019-04-04 NOTE — Progress Notes (Signed)
Patient tolerated CT well. Stated developed headache 3/10 throbbing. Headache now 0/10. Drank water after CT. Ambulated to exit steady gait.

## 2019-04-05 ENCOUNTER — Telehealth: Payer: Self-pay | Admitting: *Deleted

## 2019-04-05 DIAGNOSIS — I2510 Atherosclerotic heart disease of native coronary artery without angina pectoris: Secondary | ICD-10-CM | POA: Diagnosis not present

## 2019-04-05 NOTE — Telephone Encounter (Signed)
Attempted to reach the patient. The voicemail was full so was unable to leave a message.

## 2019-04-05 NOTE — Telephone Encounter (Signed)
-----   Message from Sanda Klein, MD sent at 04/05/2019  2:23 PM EST ----- Good news is that he does not have any bad coronary blockages. None that could cause chest pain. 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 It is important to lower his LDL cholesterol to <70.  Cholesterol 194, HDL 45, LDL 122, triglycerides 168 last fall. Needs a50% reduction in LDL, which we can only achieve with meds. He also needs to improve HDL cholesterol (meds will not help this much, need weight loss and exercise). I recommend atorvastatin 40 mg daily, improved diet and exercise as we discussed in clinic and repeat lipids in 3 months.

## 2019-04-11 ENCOUNTER — Telehealth: Payer: Self-pay | Admitting: *Deleted

## 2019-04-11 DIAGNOSIS — E782 Mixed hyperlipidemia: Secondary | ICD-10-CM

## 2019-04-11 DIAGNOSIS — R1011 Right upper quadrant pain: Secondary | ICD-10-CM

## 2019-04-11 MED ORDER — ATORVASTATIN CALCIUM 40 MG PO TABS: 40.0000 mg | ORAL_TABLET | Freq: Every day | ORAL | 5 refills | Status: DC

## 2019-04-11 NOTE — Telephone Encounter (Signed)
The patient has agreed to the ultrasound. The order has been placed. Message sent to scheduling.

## 2019-04-11 NOTE — Telephone Encounter (Signed)
Patient made aware of results and verbalized understanding.  The patient would like to know what the next step would be for him concerning the pain. He stated that it was mentioned that he may need to see someone about a possible gall bladder problem. He would like to know if he should and if he needs a referral.

## 2019-04-11 NOTE — Telephone Encounter (Signed)
First step would be to check a right upper quadrant [place], please order that (re:RUQ and R shoulder pain). Would refer further plans/referral to his PCP.

## 2019-04-11 NOTE — Telephone Encounter (Signed)
-----   Message from Sanda Klein, MD sent at 04/05/2019  2:23 PM EST ----- Good news is that he does not have any bad coronary blockages. None that could cause chest pain. 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 It is important to lower his LDL cholesterol to <70.  Cholesterol 194, HDL 45, LDL 122, triglycerides 168 last fall. Needs a50% reduction in LDL, which we can only achieve with meds. He also needs to improve HDL cholesterol (meds will not help this much, need weight loss and exercise). I recommend atorvastatin 40 mg daily, improved diet and exercise as we discussed in clinic and repeat lipids in 3 months.

## 2019-04-12 NOTE — Telephone Encounter (Signed)
Patient made aware of results and verbalized understanding-duplicate

## 2019-04-18 ENCOUNTER — Ambulatory Visit
Admission: RE | Admit: 2019-04-18 | Discharge: 2019-04-18 | Disposition: A | Discharge status: Home / Self Care | Payer: PPO | Source: Ambulatory Visit | Attending: Cardiovascular Disease | Admitting: Cardiovascular Disease

## 2019-04-18 DIAGNOSIS — R1011 Right upper quadrant pain: Secondary | ICD-10-CM

## 2019-04-18 DIAGNOSIS — K7689 Other specified diseases of liver: Secondary | ICD-10-CM | POA: Diagnosis not present

## 2019-04-22 ENCOUNTER — Other Ambulatory Visit (HOSPITAL_COMMUNITY): Payer: Self-pay | Admitting: Family Medicine

## 2019-04-22 ENCOUNTER — Other Ambulatory Visit: Payer: Self-pay | Admitting: Family Medicine

## 2019-04-22 DIAGNOSIS — K828 Other specified diseases of gallbladder: Secondary | ICD-10-CM

## 2019-04-22 DIAGNOSIS — K219 Gastro-esophageal reflux disease without esophagitis: Secondary | ICD-10-CM

## 2019-04-22 DIAGNOSIS — R110 Nausea: Secondary | ICD-10-CM

## 2019-04-30 DIAGNOSIS — W57XXXS Bitten or stung by nonvenomous insect and other nonvenomous arthropods, sequela: Secondary | ICD-10-CM | POA: Diagnosis not present

## 2019-04-30 DIAGNOSIS — R5383 Other fatigue: Secondary | ICD-10-CM | POA: Diagnosis not present

## 2019-04-30 DIAGNOSIS — M2550 Pain in unspecified joint: Secondary | ICD-10-CM | POA: Diagnosis not present

## 2019-05-06 ENCOUNTER — Ambulatory Visit (HOSPITAL_COMMUNITY)
Admission: RE | Admit: 2019-05-06 | Discharge: 2019-05-06 | Disposition: A | Discharge status: Home / Self Care | Payer: PPO | Source: Ambulatory Visit | Attending: Family Medicine | Admitting: Family Medicine

## 2019-05-06 ENCOUNTER — Other Ambulatory Visit: Payer: Self-pay

## 2019-05-06 DIAGNOSIS — R110 Nausea: Secondary | ICD-10-CM | POA: Diagnosis not present

## 2019-05-06 DIAGNOSIS — K828 Other specified diseases of gallbladder: Secondary | ICD-10-CM | POA: Diagnosis not present

## 2019-05-06 DIAGNOSIS — K219 Gastro-esophageal reflux disease without esophagitis: Secondary | ICD-10-CM | POA: Insufficient documentation

## 2019-05-06 MED ORDER — TECHNETIUM TC 99M MEBROFENIN IV KIT
5.1500 | PACK | Freq: Once | INTRAVENOUS | Status: AC | PRN
  Administered 2019-05-06: 5.15 via INTRAVENOUS

## 2019-05-13 DIAGNOSIS — M1990 Unspecified osteoarthritis, unspecified site: Secondary | ICD-10-CM | POA: Diagnosis not present

## 2019-05-13 DIAGNOSIS — M064 Inflammatory polyarthropathy: Secondary | ICD-10-CM | POA: Diagnosis not present

## 2019-05-13 DIAGNOSIS — M79643 Pain in unspecified hand: Secondary | ICD-10-CM | POA: Diagnosis not present

## 2019-05-13 DIAGNOSIS — Z8739 Personal history of other diseases of the musculoskeletal system and connective tissue: Secondary | ICD-10-CM | POA: Diagnosis not present

## 2019-05-13 DIAGNOSIS — A6923 Arthritis due to Lyme disease: Secondary | ICD-10-CM | POA: Diagnosis not present

## 2019-05-13 DIAGNOSIS — M19041 Primary osteoarthritis, right hand: Secondary | ICD-10-CM | POA: Diagnosis not present

## 2019-05-13 DIAGNOSIS — M79641 Pain in right hand: Secondary | ICD-10-CM | POA: Diagnosis not present

## 2019-05-13 DIAGNOSIS — M19042 Primary osteoarthritis, left hand: Secondary | ICD-10-CM | POA: Diagnosis not present

## 2019-05-13 DIAGNOSIS — M79642 Pain in left hand: Secondary | ICD-10-CM | POA: Diagnosis not present

## 2019-05-23 DIAGNOSIS — Z8739 Personal history of other diseases of the musculoskeletal system and connective tissue: Secondary | ICD-10-CM | POA: Diagnosis not present

## 2019-05-23 DIAGNOSIS — A6923 Arthritis due to Lyme disease: Secondary | ICD-10-CM | POA: Diagnosis not present

## 2019-05-23 DIAGNOSIS — M79643 Pain in unspecified hand: Secondary | ICD-10-CM | POA: Diagnosis not present

## 2019-05-23 DIAGNOSIS — M1990 Unspecified osteoarthritis, unspecified site: Secondary | ICD-10-CM | POA: Diagnosis not present

## 2019-05-23 DIAGNOSIS — M064 Inflammatory polyarthropathy: Secondary | ICD-10-CM | POA: Diagnosis not present

## 2019-06-09 ENCOUNTER — Other Ambulatory Visit: Payer: Self-pay | Admitting: Cardiology

## 2019-07-08 DIAGNOSIS — L570 Actinic keratosis: Secondary | ICD-10-CM | POA: Diagnosis not present

## 2019-08-27 ENCOUNTER — Ambulatory Visit (INDEPENDENT_AMBULATORY_CARE_PROVIDER_SITE_OTHER): Payer: PPO

## 2019-08-27 ENCOUNTER — Ambulatory Visit: Payer: PPO | Admitting: Orthopaedic Surgery

## 2019-08-27 ENCOUNTER — Other Ambulatory Visit: Payer: Self-pay

## 2019-08-27 ENCOUNTER — Encounter: Payer: Self-pay | Admitting: Orthopaedic Surgery

## 2019-08-27 VITALS — Ht 70.5 in | Wt 208.0 lb

## 2019-08-27 DIAGNOSIS — M545 Low back pain, unspecified: Secondary | ICD-10-CM | POA: Insufficient documentation

## 2019-08-27 DIAGNOSIS — G8929 Other chronic pain: Secondary | ICD-10-CM | POA: Diagnosis not present

## 2019-08-27 DIAGNOSIS — M170 Bilateral primary osteoarthritis of knee: Secondary | ICD-10-CM | POA: Insufficient documentation

## 2019-08-27 DIAGNOSIS — M1711 Unilateral primary osteoarthritis, right knee: Secondary | ICD-10-CM

## 2019-08-27 DIAGNOSIS — M25561 Pain in right knee: Secondary | ICD-10-CM | POA: Diagnosis not present

## 2019-08-27 MED ORDER — METHYLPREDNISOLONE ACETATE 40 MG/ML IJ SUSP
80.0000 mg | INTRAMUSCULAR | Status: AC | PRN
  Administered 2019-08-27: 80 mg via INTRA_ARTICULAR

## 2019-08-27 MED ORDER — BUPIVACAINE HCL 0.5 % IJ SOLN
2.0000 mL | INTRAMUSCULAR | Status: AC | PRN
  Administered 2019-08-27: 2 mL via INTRA_ARTICULAR

## 2019-08-27 MED ORDER — LIDOCAINE HCL 1 % IJ SOLN
2.0000 mL | INTRAMUSCULAR | Status: AC | PRN
  Administered 2019-08-27: 2 mL

## 2019-08-27 NOTE — Progress Notes (Signed)
Office Visit Note   Patient: Sean Welch           Date of Birth: 01-12-1951           MRN: [id number] Visit Date: 08/27/2019              Requested by: [name], DO [address] [place],  [place] [address] PCP: [name], DO   Assessment & Plan: Visit Diagnoses:  1. Chronic pain of right knee   2. Chronic midline low back pain, unspecified whether sciatica present   3. Chronic bilateral low back pain without sciatica   4. Bilateral primary osteoarthritis of knee     Plan: Sean Welch has advanced osteoarthritis of his right knee.  Long discussion regarding treatment options and even total knee replacement.  Sean Welch is not "ready for that yet".  Can work on exercises.  NSAIDs, Voltaren gel, and even bracing.  Not having any sensation of his knee giving way.  Will inject his knee with cortisone.  Lumbosacral spine pain appears to be related to the arthritis particularly at L4-5 and L5-S1.  Will work on strengthening exercises and consider MRI scan in the future if no improvement.  Might be a candidate for injections. Total time spent in the office about 45 minutes 50% of the time in counseling  Follow-Up Instructions: Return if symptoms worsen or fail to improve.   Orders:  Orders Placed This Encounter  Procedures  . XR KNEE 3 VIEW RIGHT  . XR Lumbar Spine 2-3 Views   No orders of the defined types were placed in this encounter.     Procedures: Large Joint Inj: R knee on 08/27/2019 2:12 PM Indications: pain and diagnostic evaluation Details: 25 G 1.5 in needle, anteromedial approach  Arthrogram: No  Medications: 2 mL lidocaine 1 %; 2 mL bupivacaine 0.5 %; 80 mg methylPREDNISolone acetate 40 MG/ML Procedure, treatment alternatives, risks and benefits explained, specific risks discussed. Consent was given by the patient. Immediately prior to procedure a time out was called to verify the correct patient, procedure, equipment, support staff and site/Welch  marked as required. Patient was prepped and draped in the usual sterile fashion.       Clinical Data: No additional findings.   Subjective: Chief Complaint  Patient presents with  . Right Knee - Pain  Patient presents today for right knee pain. Sean Welch said that his knee has hurt him for years. Some days the pain is more tolerable than other days. Sean Welch said that Sean Welch favors his knee while walking, and has noticed that his back hurts now as well. His knee pain is posterior. Sean Welch has tingling in his knee along with some swelling occasionally. No grinding or giving way. Sean Welch takes Advil if needed. Sean Welch cannot flex his right knee as much as the left. Has had multiple procedures on his right knee over the years including open medial meniscectomy by Dr. Tommie Raymond and a lateral procedure by Dr. Collie Siad.  Not having any sensation of his knee giving way.  Also relates having some chronic back pain localized more to the left than the right.  Has had some tingling in his right leg on occasion and not sure what is related.  HPI  Review of Systems  Constitutional: Negative for fatigue.  HENT: Negative for ear pain.   Eyes: Negative for pain.  Respiratory: Negative for shortness of breath.   Cardiovascular: Negative for leg swelling.  Gastrointestinal: Negative for constipation and diarrhea.  Endocrine: Negative  for cold intolerance and heat intolerance.  Genitourinary: Negative for difficulty urinating.  Musculoskeletal: Negative for joint swelling.  Skin: Negative for rash.  Allergic/Immunologic: Negative for food allergies.  Neurological: Negative for weakness.  Hematological: Does not bruise/bleed easily.  Psychiatric/Behavioral: Negative for sleep disturbance.     Objective: Vital Signs: Ht 5' 10.5" (1.791 m)   Wt 208 lb (94.3 kg)   BMI 29.42 kg/m   Physical Exam Constitutional:      Appearance: Sean Welch is well-developed.  Eyes:     Pupils: Pupils are equal, round, and reactive to light.  Pulmonary:       Effort: Pulmonary effort is normal.  Skin:    General: Skin is warm and dry.  Neurological:     Mental Status: Sean Welch is alert and oriented to person, place, and time.  Psychiatric:        Behavior: Behavior normal.     Ortho Exam awake alert and oriented x3.  Comfortable sitting.  No use of ambulatory aid.  Right knee without effusion.  Some patellar crepitation.  Has both medial lateral joint pain but full extension and 110 degrees of flexion without instability.  Multiple scars along the medial lateral aspect of his knee from prior surgery.  No calf pain.  Slight prominence of the popliteal space possibly consistent with a small popliteal cyst.  Neurologically intact.  Straight leg raise negative no percussible tenderness of the lumbar spine  Specialty Comments:  No specialty comments available.  Imaging: XR KNEE 3 VIEW RIGHT  Result Date: 08/27/2019 Films of the right knee were obtained in 3 projections standing.  There is significant tricompartmental degenerative changes with near bone-on-bone in all 3 compartments.  There is about 3 degrees of varus with subchondral sclerosis on both sides of the medial joint and small peripheral osteophytes.  There are degenerative changes in the lateral compartment and patellofemoral joints as well.  Films are consistent with advanced osteoarthritis without acute change.    PMFS History: Patient Active Problem List   Diagnosis Date Noted  . Low back pain 08/27/2019  . Bilateral primary osteoarthritis of knee 08/27/2019  . Systemic hypertension   . PFO (patent foramen ovale)   . TIA (transient ischemic attack)   . 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 Status post device closure of ASD 11/09/2012  . HTN (hypertension) 11/09/2012  . Overweight 11/09/2012   Past Medical History:  Diagnosis Date  . PFO (patent foramen ovale)    repair 02/2005  . Systemic  hypertension   . TIA (transient ischemic attack)     Family History  Problem Relation Age of Onset  . Heart attack Mother   . Cancer Mother   . Hypertension Mother   . Healthy Father   . Healthy Sister   . Healthy Sister   . Healthy Child   . Healthy Child     Past Surgical History:  Procedure Laterality Date  . NM MYOCAR PERF WALL MOTION  04/01/2008   Normal  . PATENT FORAMEN OVALE CLOSURE  02/11/2005  . SHOULDER ARTHROSCOPY    . TEE WITHOUT CARDIOVERSION  02/11/2005  . US ECHOCARDIOGRAPHY  10/19/2011   mild concentric LVH,mechanical PFO closure device, MV leaflet valve thickening   Social History   Occupational History  . Not on file  Tobacco Use  . Smoking status: Never Smoker  . Smokeless tobacco: Never Used  Vaping Use  . Vaping Use: Never used  Substance and Sexual Activity  . Alcohol use: Yes    Comment: couple times a month  . Drug use: No  . Sexual activity: Not on file

## 2019-09-30 ENCOUNTER — Other Ambulatory Visit: Payer: Self-pay | Admitting: Cardiovascular Disease

## 2019-10-02 NOTE — Telephone Encounter (Signed)
Rx(s) sent to pharmacy electronically.  

## 2019-10-11 ENCOUNTER — Encounter: Payer: Self-pay | Admitting: *Deleted

## 2019-10-11 ENCOUNTER — Other Ambulatory Visit: Payer: Self-pay | Admitting: *Deleted

## 2019-10-11 DIAGNOSIS — E782 Mixed hyperlipidemia: Secondary | ICD-10-CM

## 2020-01-13 DIAGNOSIS — L570 Actinic keratosis: Secondary | ICD-10-CM | POA: Diagnosis not present

## 2020-03-24 ENCOUNTER — Telehealth: Payer: Self-pay | Admitting: Cardiovascular Disease

## 2020-03-24 NOTE — Telephone Encounter (Signed)
Spoke with patient to schedule 12 month follow  Up with Dr. Margarette Asal states he is out of town and will call back to schedule---he also needs to get his lab work prior to his appointment

## 2020-04-01 DIAGNOSIS — R7309 Other abnormal glucose: Secondary | ICD-10-CM | POA: Diagnosis not present

## 2020-04-01 DIAGNOSIS — R7989 Other specified abnormal findings of blood chemistry: Secondary | ICD-10-CM | POA: Diagnosis not present

## 2020-04-01 DIAGNOSIS — I10 Essential (primary) hypertension: Secondary | ICD-10-CM | POA: Diagnosis not present

## 2020-04-01 DIAGNOSIS — Z0000 Encounter for general adult medical examination without abnormal findings: Secondary | ICD-10-CM | POA: Diagnosis not present

## 2020-04-01 DIAGNOSIS — M109 Gout, unspecified: Secondary | ICD-10-CM | POA: Diagnosis not present

## 2020-04-06 ENCOUNTER — Other Ambulatory Visit: Payer: Self-pay | Admitting: Cardiovascular Disease

## 2020-06-03 ENCOUNTER — Other Ambulatory Visit: Payer: Self-pay | Admitting: Cardiology

## 2020-06-24 DIAGNOSIS — R7309 Other abnormal glucose: Secondary | ICD-10-CM | POA: Diagnosis not present

## 2020-06-24 DIAGNOSIS — R7989 Other specified abnormal findings of blood chemistry: Secondary | ICD-10-CM | POA: Diagnosis not present

## 2020-06-24 DIAGNOSIS — M109 Gout, unspecified: Secondary | ICD-10-CM | POA: Diagnosis not present

## 2020-06-24 DIAGNOSIS — Z0000 Encounter for general adult medical examination without abnormal findings: Secondary | ICD-10-CM | POA: Diagnosis not present

## 2020-06-30 DIAGNOSIS — I10 Essential (primary) hypertension: Secondary | ICD-10-CM | POA: Diagnosis not present

## 2020-06-30 DIAGNOSIS — K219 Gastro-esophageal reflux disease without esophagitis: Secondary | ICD-10-CM | POA: Diagnosis not present

## 2020-06-30 DIAGNOSIS — Z0000 Encounter for general adult medical examination without abnormal findings: Secondary | ICD-10-CM | POA: Diagnosis not present

## 2020-06-30 DIAGNOSIS — R7303 Prediabetes: Secondary | ICD-10-CM | POA: Diagnosis not present

## 2020-06-30 DIAGNOSIS — R7989 Other specified abnormal findings of blood chemistry: Secondary | ICD-10-CM | POA: Diagnosis not present

## 2020-06-30 DIAGNOSIS — Z23 Encounter for immunization: Secondary | ICD-10-CM | POA: Diagnosis not present

## 2020-07-20 DIAGNOSIS — L570 Actinic keratosis: Secondary | ICD-10-CM | POA: Diagnosis not present

## 2020-09-08 NOTE — Progress Notes (Signed)
Cardiology Office Note    Date:  09/09/2020   ID:  Sean Welch, DOB 19-Jul-1950, MRN [id number]  PCP:  Sean Morning, DO  Cardiologist:   Sean Klein, MD   Chief Complaint  Patient presents with   Coronary Artery Disease     History of Present Illness:  Sean Welch is a 70 y.o. 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 His wife Sean Welch is also my patient. Coronary CTA January 2021 showed moderate nonobstructive disease (50% proximal LAD, FFR>0.9), but elevated calcium score (293, 71st percentile).  He complains of bilateral hand pain, on wrist, thumbs, dorsum of hands, very bad trying to play golf. Saw a rheumatologist who diagnosed osteoarthritis.  The patient specifically denies any chest pain at rest exertion, dyspnea at rest or with exertion, orthopnea, paroxysmal nocturnal dyspnea, syncope, palpitations, focal neurological deficits, intermittent claudication, lower extremity edema, unexplained weight gain, cough, hemoptysis or wheezing. No recent gout.  He has mildly elevated LDL cholesterol of 122, down to 60 on statin.  He has hypertension and a history of TIA.  In the end, his parents moved to [place] and are living in their own home inext door to his sister.    Past Medical History:  Diagnosis Date   PFO (patent foramen ovale)    repair 02/2005   Systemic hypertension    TIA (transient ischemic attack)     Past Surgical History:  Procedure Laterality Date   NM MYOCAR PERF WALL MOTION  04/01/2008   Normal   PATENT FORAMEN OVALE CLOSURE  02/11/2005   SHOULDER ARTHROSCOPY     TEE WITHOUT CARDIOVERSION  02/11/2005   US ECHOCARDIOGRAPHY  10/19/2011   mild concentric LVH,mechanical PFO closure device, MV leaflet valve thickening    Current Medications: Outpatient Medications Prior to Visit  Medication Sig  Dispense Refill   allopurinol (ZYLOPRIM) 100 MG tablet Take 100 mg by mouth daily.  0   aspirin 81 MG tablet Take 81 mg by mouth daily.     atorvastatin (LIPITOR) 40 MG tablet TAKE 1 TABLET BY MOUTH EVERY DAY 90 tablet 1   benazepril (LOTENSIN) 20 MG tablet TAKE 1/2 TABLET BY MOUTH DAILY 45 tablet 3   colchicine 0.6 MG tablet Take 0.6 mg by mouth daily as needed.  2   famotidine (PEPCID) 20 MG tablet Take 20 mg by mouth daily.     metoprolol tartrate (LOPRESSOR) 100 MG tablet Take within two hours of the test 1 tablet 0   Multiple Vitamin (MULTIVITAMIN) tablet Take 1 tablet by mouth daily.     tadalafil (CIALIS) 5 MG tablet Take 5 mg by mouth daily.  1   tamsulosin (FLOMAX) 0.4 MG CAPS capsule Take 0.4 mg by mouth daily.     No facility-administered medications prior to visit.     Allergies:   Patient has no known allergies.   Social History   Socioeconomic History   Marital status: Married    Spouse name: Not on file   Number of children: Not on file   Years of education: Not on file   Highest education level: Not on file  Occupational History   Not on file  Tobacco Use   Smoking status: Never   Smokeless tobacco: Never  Vaping Use   Vaping Use: Never used  Substance and Sexual Activity   Alcohol use: Yes    Comment: couple  times a month   Drug use: No   Sexual activity: Not on file  Other Topics Concern   Not on file  Social History Narrative   Not on file   Social Determinants of Health   Financial Resource Strain: Not on file  Food Insecurity: Not on file  Transportation Needs: Not on file  Physical Activity: Not on file  Stress: Not on file  Social Connections: Not on file     Family History:  The patient's family history includes Cancer in his mother; Healthy in his child, child, father, sister, and sister; Heart attack in his mother; Hypertension in his mother.   ROS:   Please see the history of present illness.    ROS All other systems are reviewed and  are negative.   PHYSICAL EXAM:   VS:  BP 110/72   Pulse 68   Ht 5\' 10"  (1.778 m)   Wt 210 lb 6.4 oz (95.4 kg)   SpO2 96%   BMI 30.19 kg/m      General: Alert, oriented x3, no distress, obese, but appears fit. Head: no evidence of trauma, PERRL, EOMI, no exophtalmos or lid lag, no myxedema, no xanthelasma; normal ears, nose and oropharynx Neck: normal jugular venous pulsations and no hepatojugular reflux; brisk carotid pulses without delay and no carotid bruits Chest: clear to auscultation, no signs of consolidation by percussion or palpation, normal fremitus, symmetrical and full respiratory excursions Cardiovascular: normal position and quality of the apical impulse, regular rhythm, normal first and second heart sounds, no murmurs, rubs or gallops Abdomen: no tenderness or distention, no masses by palpation, no abnormal pulsatility or arterial bruits, normal bowel sounds, no hepatosplenomegaly Extremities: no clubbing, cyanosis or edema; 2+ radial, ulnar and brachial pulses bilaterally; 2+ right femoral, posterior tibial and dorsalis pedis pulses; 2+ left femoral, posterior tibial and dorsalis pedis pulses; no subclavian or femoral bruits Neurological: grossly nonfocal Psych: Normal mood and affect   Wt Readings from Last 3 Encounters:  09/09/20 210 lb 6.4 oz (95.4 kg)  08/27/19 208 lb (94.3 kg)  03/11/19 211 lb (95.7 kg)      Studies/Labs Reviewed:   ECHO 04/16/2018:  1. The left ventricle has normal systolic function of 24-40%. The cavity size was normal. There is no increased left ventricular wall thickness. Left ventricular diastolic parameters were indeterminate.  2. No evidence of left ventricular regional wall motion abnormalities.  3. The right ventricle has normal systolic function. The cavity was normal. There is no increase in right ventricular wall thickness.  4. Septal closure device is present. There is no evidence of shunt by color Doppler. Saline contrast study  was not performed.  Coronary CT Angio 04/05/2019: 1. Coronary calcium score of 293. This was 71st percentile for age and sex matched control.   2. Normal coronary origin with right dominance.   3. Calcified and non calcified plaque in the proximal LAD causing moderate (50%) stenosis.   4. CAD-RADS 3. Moderate stenosis. Consider symptom-guided anti-ischemic pharmacotherapy as well as risk factor modification per guideline directed care. Additional analysis with CT FFR will be submitted and reported separately. FINDINGS: FFRct analysis was performed on the original cardiac CT angiogram dataset. Diagrammatic representation of the FFRct analysis is provided in a separate PDF document in PACS. This dictation was created using the PDF document and an interactive 3D model of the results. 3D model is not available in the EMR/PACS. Normal FFR range is >0.80.   1. Left Main:  No significant  stenosis.   2. LAD: No significant stenosis. FFR>0.9 in the proxima and mid segments,0.88 in the distal segment 3. LCX: No significant stenosis. FFR >0.9 in the proximal segment, 0.85 in the LCx distal segment 4. RCA: No significant stenosis.  FFR 0.92   IMPRESSION: 1.  CT FFR analysis didn't show any significant stenosis.   EKG:  EKG is ordered today.  It is very similar to past tracings, NSR and left axis deviation almost at threshold for LAFB.  Recent Labs: No results found for requested labs within last 8760 hours.  12/07/2018 Cholesterol 194, HDL 45, LDL 122, triglycerides 168 A1c 5.9% Creatinine 1.13, potassium 4.7, normal LFTs, TSH 3.63 Lipid Panel    Component Value Date/Time   CHOL  03/24/2008 0432    182        ATP III CLASSIFICATION:  <200     mg/dL   Desirable  200-239  mg/dL   Borderline High  >=240    mg/dL   High          TRIG 121 03/24/2008 0432   HDL 36 (L) 03/24/2008 0432   CHOLHDL 5.1 03/24/2008 0432   VLDL 24 03/24/2008 0432   LDLCALC (H) 03/24/2008 0432    122         Total Cholesterol/HDL:CHD Risk Coronary Heart Disease Risk Table                     Men   Women  1/2 Average Risk   3.4   3.3  Average Risk       5.0   4.4  2 X Average Risk   9.6   7.1  3 X Average Risk  23.4   11.0        Use the calculated Patient Ratio above and the CHD Risk Table to determine the patient's CHD Risk.        ATP III CLASSIFICATION (LDL):  <100     mg/dL   Optimal  100-129  mg/dL   Near or Above                    Optimal  130-159  mg/dL   Borderline  160-189  mg/dL   High  >190     mg/dL   Very High   06/24/2020 Cholesterol 130, HDL 52, TG 99, LDL 60 A1c 6.3% Creat 1.15, TSH 3.84  ASSESSMENT:    1. Coronary artery disease involving native coronary artery of native heart without angina pectoris   2. Essential hypertension   3. Mixed hyperlipidemia   4. Overweight   5. Status post device closure of ASD      PLAN:  In order of problems listed above:  CAD: nonobstructive, moderately extensive plaque. Asymptomatic. On ASA, beta blocker, statin. HTN: excellent control. HLP: atorvastatin increased after CCTA showed elevated calcium score and moderate plaque. Target LDL<70. Improved LDL and HDL on statin. Continue. Prediabetes/ borderline obese: A1c is elevated almost at threshold for diagnosis of diabetes  Encouraged weight loss and restarting regular physical activity. Set target of 195 lb next year and waist<34 inches. He weighed 189 lb at end of high school. Discussed glycemic index and heathy diet and lifestyle changes. Can bike, right knee pain limits walking. ASD closure: Asymptomatic, no abnormalities on physical exam.  No evidence for residual shunt on last echo, but saline contrast was not administered.    Medication Adjustments/Labs and Tests Ordered: Current medicines are reviewed at length with the  patient today.  Concerns regarding medicines are outlined above.  Medication changes, Labs and Tests ordered today are listed in the Patient  Instructions below. Patient Instructions  Medication Instructions:  No changes *If you need a refill on your cardiac medications before your next appointment, please call your pharmacy*   Lab Work: None ordered If you have labs (blood work) drawn today and your tests are completely normal, you will receive your results only by: [place] (if you have MyChart) OR A paper copy in the mail If you have any lab test that is abnormal or we need to change your treatment, we will call you to review the results.   Testing/Procedures: None ordered   Follow-Up: At [facility], you and your health needs are our priority.  As part of our continuing mission to provide you with exceptional heart care, we have created designated Provider Care Teams.  These Care Teams include your primary Cardiologist (physician) and Advanced Practice Providers (APPs -  Physician Assistants and Nurse Practitioners) who all work together to provide you with the care you need, when you need it.  We recommend signing up for the patient portal called "MyChart".  Sign up information is provided on this After Visit Summary.  MyChart is used to connect with patients for Virtual Visits (Telemedicine).  Patients are able to view lab/test results, encounter notes, upcoming appointments, etc.  Non-urgent messages can be sent to your provider as well.   To learn more about what you can do with MyChart, go to [email or web address].    Your next appointment:   12 month(s)  The format for your next appointment:   In Person  Provider:   You may see Sean Klein, MD or one of the following Advanced Practice Providers on your designated Care Team:   Sande Rives, PA-C Coletta Memos, FNP      Signed, Sean Klein, MD  09/09/2020 8:35 AM    [address], [place], [place]  [address] Phone: [phone]; Fax: [phone]

## 2020-09-09 ENCOUNTER — Encounter: Payer: Self-pay | Admitting: Cardiovascular Disease

## 2020-09-09 ENCOUNTER — Ambulatory Visit: Payer: Medicare Other | Admitting: Cardiovascular Disease

## 2020-09-09 ENCOUNTER — Other Ambulatory Visit: Payer: Self-pay

## 2020-09-09 VITALS — BP 110/72 | HR 68 | Ht 70.0 in | Wt 210.4 lb

## 2020-09-09 DIAGNOSIS — I2510 Atherosclerotic heart disease of native coronary artery without angina pectoris: Secondary | ICD-10-CM

## 2020-09-09 DIAGNOSIS — Z8774 Personal history of (corrected) congenital malformations of heart and circulatory system: Secondary | ICD-10-CM

## 2020-09-09 DIAGNOSIS — I10 Essential (primary) hypertension: Secondary | ICD-10-CM

## 2020-09-09 DIAGNOSIS — E663 Overweight: Secondary | ICD-10-CM | POA: Diagnosis not present

## 2020-09-09 DIAGNOSIS — E782 Mixed hyperlipidemia: Secondary | ICD-10-CM | POA: Diagnosis not present

## 2020-09-09 NOTE — Patient Instructions (Signed)
Medication Instructions:  No changes *If you need a refill on your cardiac medications before your next appointment, please call your pharmacy*   Lab Work: None ordered If you have labs (blood work) drawn today and your tests are completely normal, you will receive your results only by: [place] (if you have MyChart) OR A paper copy in the mail If you have any lab test that is abnormal or we need to change your treatment, we will call you to review the results.   Testing/Procedures: None ordered   Follow-Up: At [facility], you and your health needs are our priority.  As part of our continuing mission to provide you with exceptional heart care, we have created designated Provider Care Teams.  These Care Teams include your primary Cardiologist (physician) and Advanced Practice Providers (APPs -  Physician Assistants and Nurse Practitioners) who all work together to provide you with the care you need, when you need it.  We recommend signing up for the patient portal called "MyChart".  Sign up information is provided on this After Visit Summary.  MyChart is used to connect with patients for Virtual Visits (Telemedicine).  Patients are able to view lab/test results, encounter notes, upcoming appointments, etc.  Non-urgent messages can be sent to your provider as well.   To learn more about what you can do with MyChart, go to [email or web address].    Your next appointment:   12 month(s)  The format for your next appointment:   In Person  Provider:   You may see Sanda Klein, MD or one of the following Advanced Practice Providers on your designated Care Team:   [place], PA-C Coletta Memos, FNP

## 2020-10-06 ENCOUNTER — Other Ambulatory Visit: Payer: Self-pay | Admitting: Cardiovascular Disease

## 2020-10-07 MED ORDER — ATORVASTATIN CALCIUM 40 MG PO TABS: 40.0000 mg | ORAL_TABLET | Freq: Every day | ORAL | 2 refills | Status: DC

## 2020-10-12 DIAGNOSIS — M5416 Radiculopathy, lumbar region: Secondary | ICD-10-CM | POA: Diagnosis not present

## 2020-10-12 DIAGNOSIS — M2578 Osteophyte, vertebrae: Secondary | ICD-10-CM | POA: Diagnosis not present

## 2020-10-12 DIAGNOSIS — M5136 Other intervertebral disc degeneration, lumbar region: Secondary | ICD-10-CM | POA: Diagnosis not present

## 2021-01-20 DIAGNOSIS — L570 Actinic keratosis: Secondary | ICD-10-CM | POA: Diagnosis not present

## 2021-03-16 DIAGNOSIS — M5116 Intervertebral disc disorders with radiculopathy, lumbar region: Secondary | ICD-10-CM | POA: Diagnosis not present

## 2021-03-25 DIAGNOSIS — I951 Orthostatic hypotension: Secondary | ICD-10-CM | POA: Diagnosis not present

## 2021-03-25 DIAGNOSIS — R42 Dizziness and giddiness: Secondary | ICD-10-CM | POA: Diagnosis not present

## 2021-03-25 DIAGNOSIS — E559 Vitamin D deficiency, unspecified: Secondary | ICD-10-CM | POA: Diagnosis not present

## 2021-03-25 DIAGNOSIS — R231 Pallor: Secondary | ICD-10-CM | POA: Diagnosis not present

## 2021-04-01 DIAGNOSIS — E559 Vitamin D deficiency, unspecified: Secondary | ICD-10-CM | POA: Diagnosis not present

## 2021-04-01 DIAGNOSIS — I951 Orthostatic hypotension: Secondary | ICD-10-CM | POA: Diagnosis not present

## 2021-04-01 DIAGNOSIS — R42 Dizziness and giddiness: Secondary | ICD-10-CM | POA: Diagnosis not present

## 2021-04-01 DIAGNOSIS — R231 Pallor: Secondary | ICD-10-CM | POA: Diagnosis not present

## 2021-04-06 ENCOUNTER — Telehealth: Payer: Self-pay | Admitting: *Deleted

## 2021-04-06 NOTE — Telephone Encounter (Signed)
Call placed to the patient. Notes received from the PCP that the patient was having some fatigue and weakness. The patient stated that the PCP had taken him off of the Benazepril and advised him to keep a record of his blood pressures.   1/20: Morning: 111/72 Night: 141/80 (ate pork)  1/21: Morning: 126/80 Night: 116/80  1/22 Morning: 114/76 Night: 116/80  1/23: Morning: 110/86 Night: 126/80  1/24: Morning: 114/78 Night: 131/79  1/25 Morning: 109/76 Night: 141/79 (ate pork)  The patient stated that he has felt much better after stopping the Benazepril. He does not feel like he needs an appointment but does want recommendations from Dr. Sallyanne Kuster.  He has been advised to keep tracking his blood pressure and stay away from the high salt foods such as the pork.

## 2021-04-06 NOTE — Telephone Encounter (Signed)
Patient has been made aware and verbalized his understanding:  Croitoru, Mihai, MD  You 7 minutes ago (1:46 PM)   Most of those readings ar excellent. As long as he follows a low sodium diet, I think he can stay off the benazepril.

## 2021-06-03 ENCOUNTER — Other Ambulatory Visit: Payer: Self-pay | Admitting: Cardiology

## 2021-06-03 NOTE — Telephone Encounter (Signed)
The original prescription was discontinued on 04/06/2021 by Ricci Barker, RN for the following reason: Discontinued by provider ?

## 2021-06-19 IMAGING — US US ABDOMEN LIMITED
1 series · 14 of 25 positions shown · non-contrast
Comparison: None.

CLINICAL DATA: Right upper quadrant pain for 2 months.

EXAM:
ULTRASOUND ABDOMEN LIMITED RIGHT UPPER QUADRANT

[Series 1: us abdomen limited · 0.22mm/px · 14 of 48 slices shown]
[im 1/48]
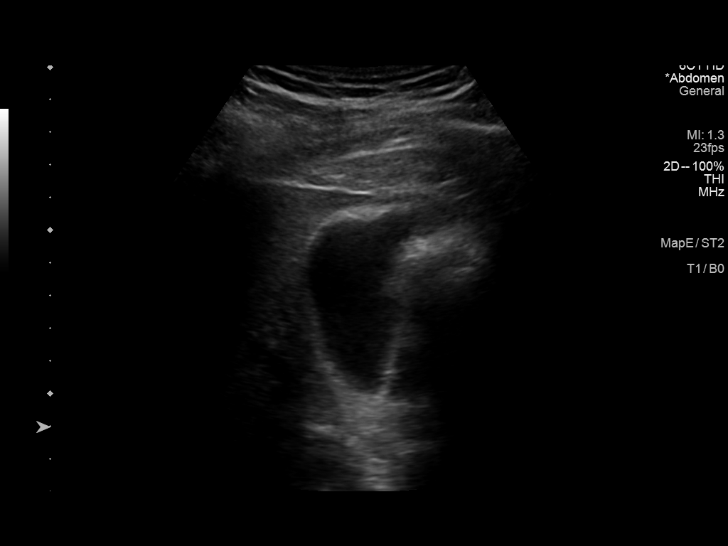
[im 4/48]
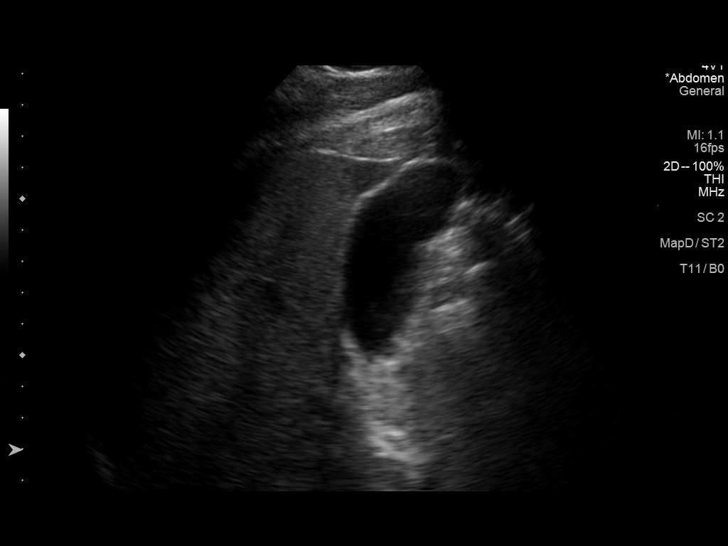
[im 8/48]
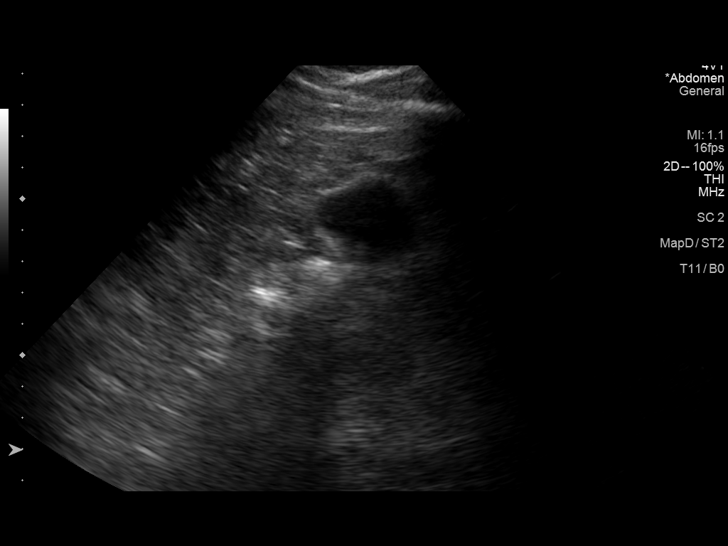
[im 12/48]
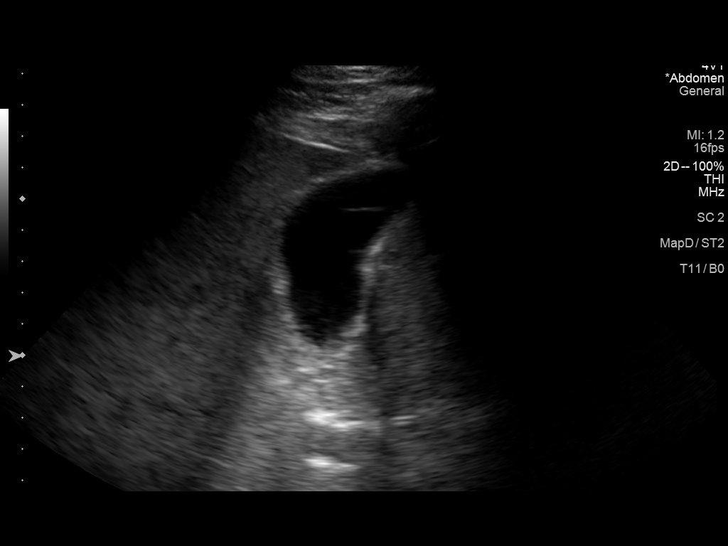
[im 16/48]
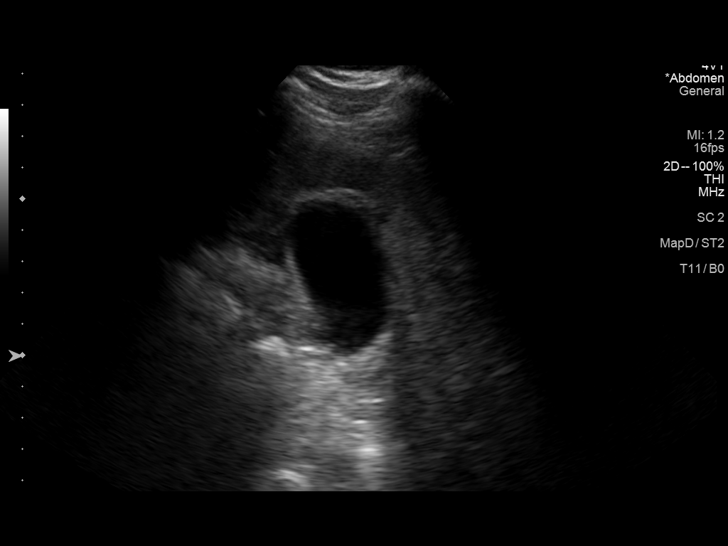
[im 18/48]
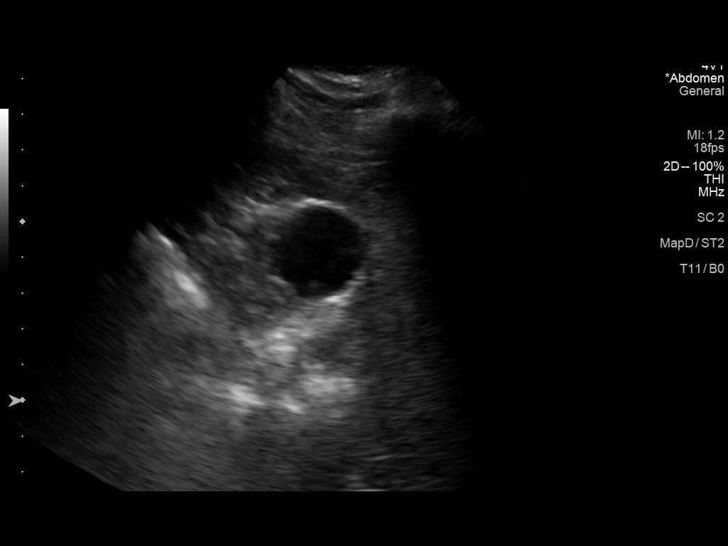
[im 22/48]
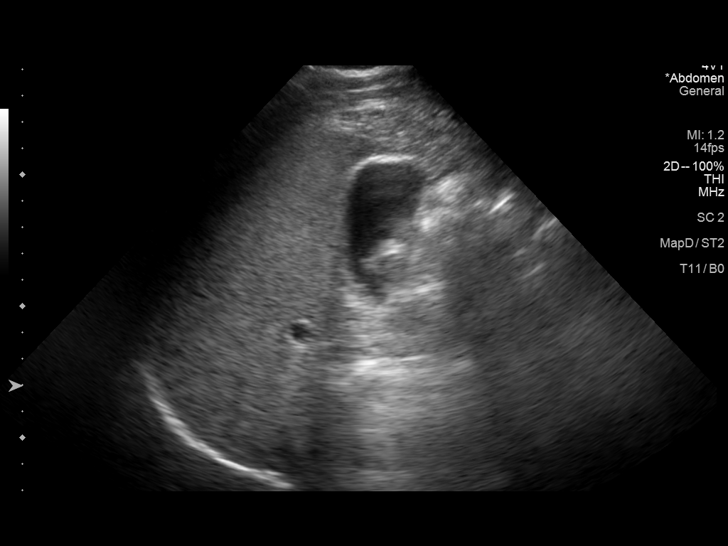
[im 26/48]
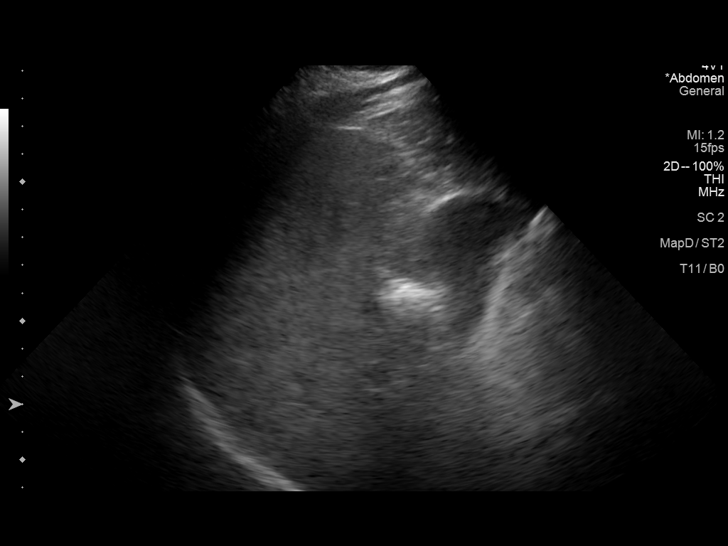
[im 30/48]
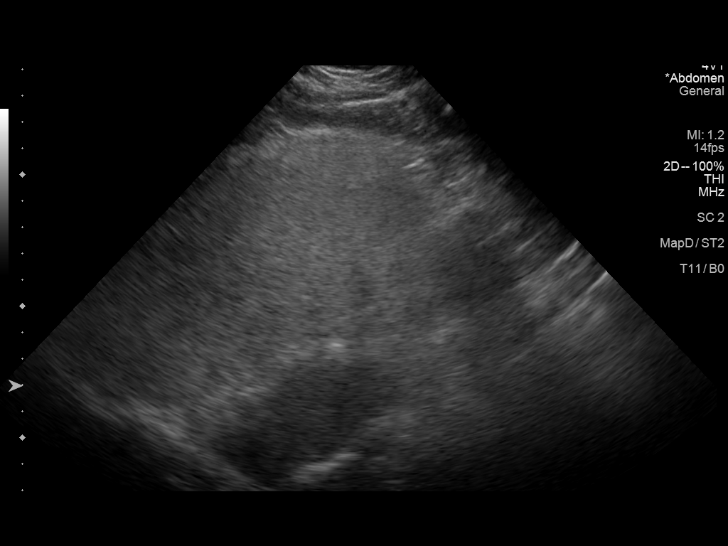
[im 32/48]
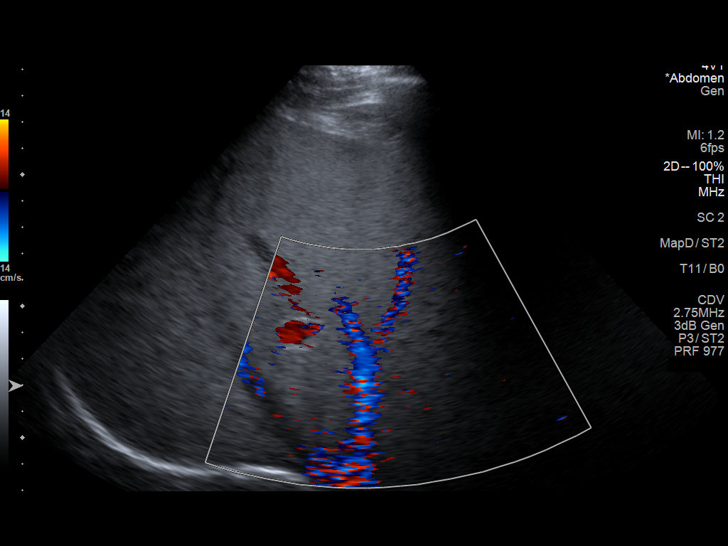
[im 36/48]
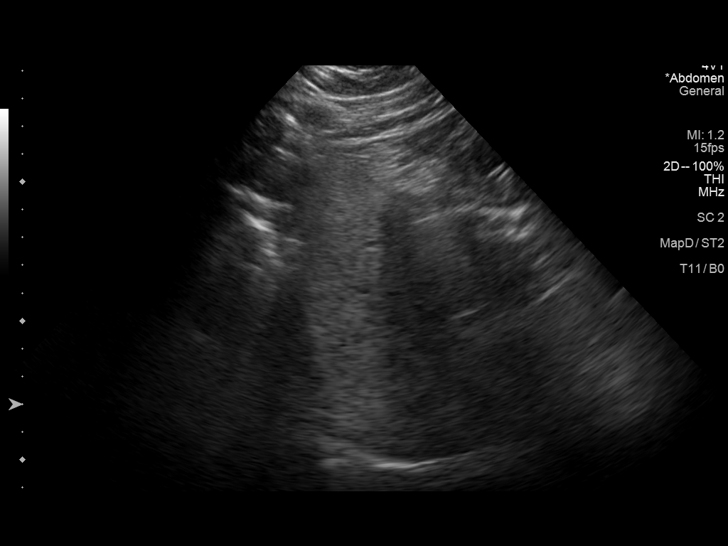
[im 40/48]
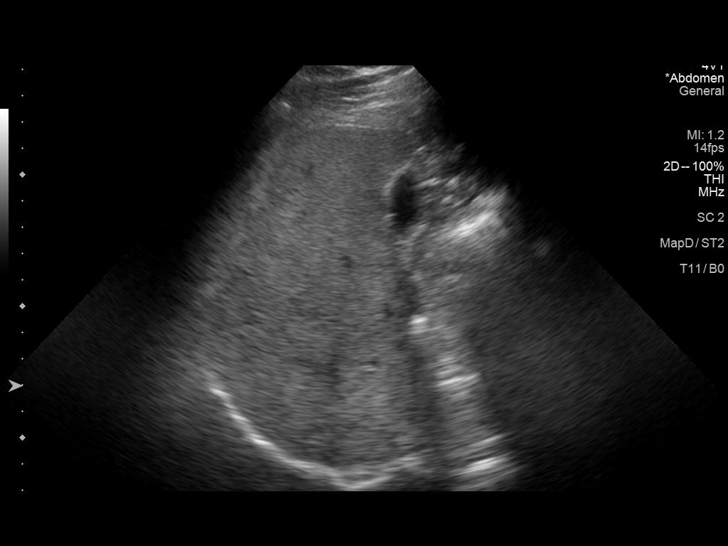
[im 44/48]
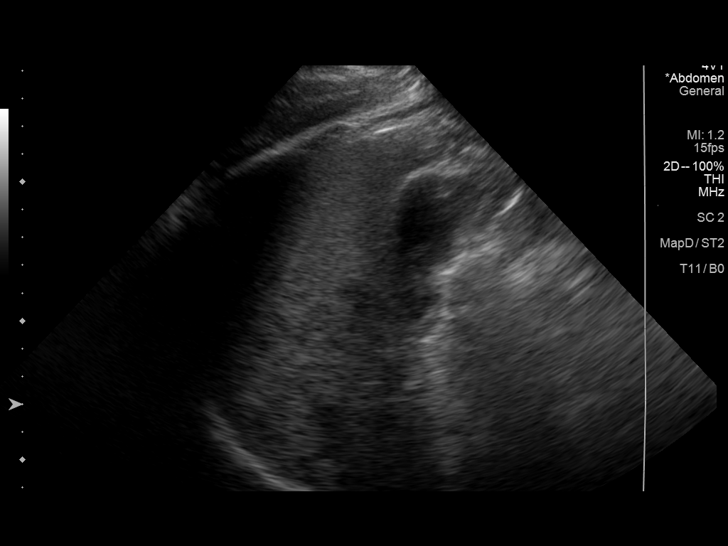
[im 48/48]
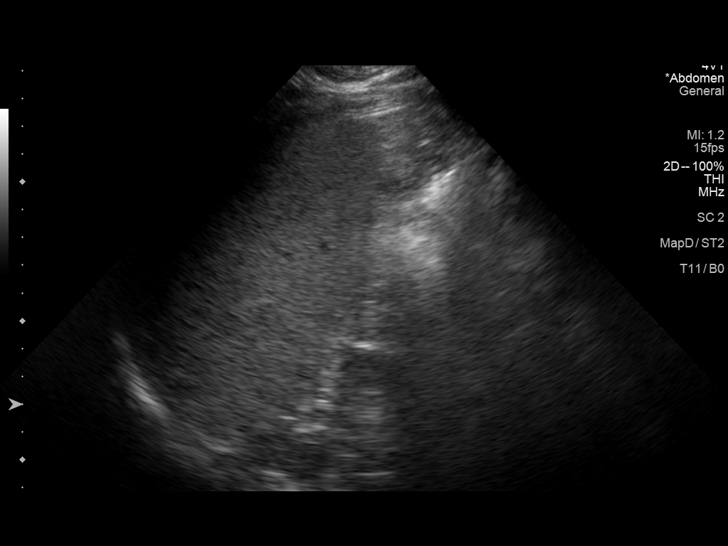

[14 of 25 positions shown; findings below may reference images not displayed]

FINDINGS: Gallbladder:

No gallstones or wall thickening visualized. Possible small amount
of biliary sludge. No sonographic Murphy sign noted by sonographer.

Common bile duct:

Diameter: 0.4 cm, within normal limits.

Liver:

No focal lesion identified. The liver parenchymal echogenicity is
diffusely increased. Portal vein is patent on color Doppler imaging
with normal direction of blood flow towards the liver.

Other: None.
IMPRESSION: 1.  No acute findings in the right upper quadrant.

2.  Possible small amount of biliary sludge.

3. Diffusely increased liver parenchymal echogenicity most commonly
seen with hepatic steatosis.

## 2021-06-23 DIAGNOSIS — I129 Hypertensive chronic kidney disease with stage 1 through stage 4 chronic kidney disease, or unspecified chronic kidney disease: Secondary | ICD-10-CM | POA: Diagnosis not present

## 2021-06-23 DIAGNOSIS — E785 Hyperlipidemia, unspecified: Secondary | ICD-10-CM | POA: Diagnosis not present

## 2021-06-23 DIAGNOSIS — R739 Hyperglycemia, unspecified: Secondary | ICD-10-CM | POA: Diagnosis not present

## 2021-06-23 DIAGNOSIS — Z0000 Encounter for general adult medical examination without abnormal findings: Secondary | ICD-10-CM | POA: Diagnosis not present

## 2021-06-23 DIAGNOSIS — R7989 Other specified abnormal findings of blood chemistry: Secondary | ICD-10-CM | POA: Diagnosis not present

## 2021-06-23 DIAGNOSIS — R7303 Prediabetes: Secondary | ICD-10-CM | POA: Diagnosis not present

## 2021-06-23 DIAGNOSIS — N182 Chronic kidney disease, stage 2 (mild): Secondary | ICD-10-CM | POA: Diagnosis not present

## 2021-06-30 ENCOUNTER — Other Ambulatory Visit: Payer: Self-pay | Admitting: Cardiovascular Disease

## 2021-07-06 DIAGNOSIS — E785 Hyperlipidemia, unspecified: Secondary | ICD-10-CM | POA: Diagnosis not present

## 2021-07-06 DIAGNOSIS — K219 Gastro-esophageal reflux disease without esophagitis: Secondary | ICD-10-CM | POA: Diagnosis not present

## 2021-07-06 DIAGNOSIS — E559 Vitamin D deficiency, unspecified: Secondary | ICD-10-CM | POA: Diagnosis not present

## 2021-07-06 DIAGNOSIS — Z0000 Encounter for general adult medical examination without abnormal findings: Secondary | ICD-10-CM | POA: Diagnosis not present

## 2021-07-06 DIAGNOSIS — N182 Chronic kidney disease, stage 2 (mild): Secondary | ICD-10-CM | POA: Diagnosis not present

## 2021-07-06 DIAGNOSIS — M1990 Unspecified osteoarthritis, unspecified site: Secondary | ICD-10-CM | POA: Diagnosis not present

## 2021-07-06 DIAGNOSIS — M109 Gout, unspecified: Secondary | ICD-10-CM | POA: Diagnosis not present

## 2021-07-07 IMAGING — NM NM HEPATO W/GB/PHARM/[PERSON_NAME]
2 series · 12 of 12 positions shown · non-contrast
Comparison: Abdominal ultrasound of 04/18/2019

CLINICAL DATA: Nausea and gallbladder sludge.

EXAM:
NUCLEAR MEDICINE HEPATOBILIARY IMAGING WITH GALLBLADDER EF
TECHNIQUE: Sequential images of the abdomen were obtained [DATE] minutes
following intravenous administration of radiopharmaceutical. After
oral ingestion of Ensure, gallbladder ejection fraction was
determined. At 60 min, normal ejection fraction is greater than 33%.
RADIOPHARMACEUTICALS:  5.2 mCi Dc-EEm  Choletec IV

[he hepatobiliary · 4.52mm/px · 6 of 60 frames shown (1 of 2)]
[frame 6/60]
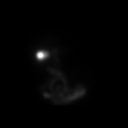
[frame 16/60]
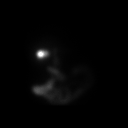
[frame 26/60]
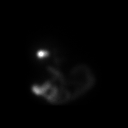
[frame 36/60]
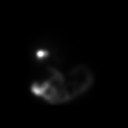
[frame 46/60]
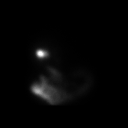
[frame 56/60]
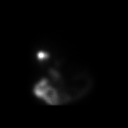

[he hepatobiliary · 4.52mm/px · 6 of 60 frames shown (2 of 2)]
[frame 6/60]
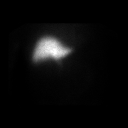
[frame 16/60]
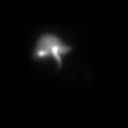
[frame 26/60]
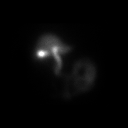
[frame 36/60]
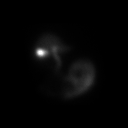
[frame 46/60]
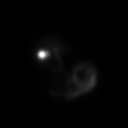
[frame 56/60]
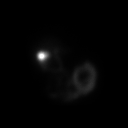

[12 of 12 positions shown; findings below may reference images not displayed]

FINDINGS: Satisfactory uptake of radiopharmaceutical from the blood pool
noted. Biliary activity is visible at 8 minutes with gallbladder
activity visible at 13 minutes and bowel activity visible at 20
minutes.

The patient did not node discomfort after drinking the Ensure meal.

Calculated gallbladder ejection fraction is 53%. (Normal gallbladder
ejection fraction with Ensure is greater than 33%.)
IMPRESSION: Normal hepatobiliary scan, with gallbladder ejection fraction of
53%.

## 2021-07-21 DIAGNOSIS — L821 Other seborrheic keratosis: Secondary | ICD-10-CM | POA: Diagnosis not present

## 2021-07-21 DIAGNOSIS — L570 Actinic keratosis: Secondary | ICD-10-CM | POA: Diagnosis not present

## 2021-09-15 DIAGNOSIS — M79644 Pain in right finger(s): Secondary | ICD-10-CM | POA: Diagnosis not present

## 2021-09-15 DIAGNOSIS — M79645 Pain in left finger(s): Secondary | ICD-10-CM | POA: Diagnosis not present

## 2021-09-15 DIAGNOSIS — M180 Bilateral primary osteoarthritis of first carpometacarpal joints: Secondary | ICD-10-CM | POA: Diagnosis not present

## 2021-10-13 DIAGNOSIS — M180 Bilateral primary osteoarthritis of first carpometacarpal joints: Secondary | ICD-10-CM | POA: Diagnosis not present

## 2022-01-11 DIAGNOSIS — E559 Vitamin D deficiency, unspecified: Secondary | ICD-10-CM | POA: Diagnosis not present

## 2022-01-11 DIAGNOSIS — N182 Chronic kidney disease, stage 2 (mild): Secondary | ICD-10-CM | POA: Diagnosis not present

## 2022-01-11 DIAGNOSIS — E785 Hyperlipidemia, unspecified: Secondary | ICD-10-CM | POA: Diagnosis not present

## 2022-01-11 DIAGNOSIS — M109 Gout, unspecified: Secondary | ICD-10-CM | POA: Diagnosis not present

## 2022-01-18 DIAGNOSIS — E785 Hyperlipidemia, unspecified: Secondary | ICD-10-CM | POA: Diagnosis not present

## 2022-01-18 DIAGNOSIS — M109 Gout, unspecified: Secondary | ICD-10-CM | POA: Diagnosis not present

## 2022-01-18 DIAGNOSIS — L570 Actinic keratosis: Secondary | ICD-10-CM | POA: Diagnosis not present

## 2022-01-18 DIAGNOSIS — Z23 Encounter for immunization: Secondary | ICD-10-CM | POA: Diagnosis not present

## 2022-01-18 DIAGNOSIS — N182 Chronic kidney disease, stage 2 (mild): Secondary | ICD-10-CM | POA: Diagnosis not present

## 2022-01-18 DIAGNOSIS — M1990 Unspecified osteoarthritis, unspecified site: Secondary | ICD-10-CM | POA: Diagnosis not present

## 2022-01-18 DIAGNOSIS — K219 Gastro-esophageal reflux disease without esophagitis: Secondary | ICD-10-CM | POA: Diagnosis not present

## 2022-01-18 DIAGNOSIS — R7303 Prediabetes: Secondary | ICD-10-CM | POA: Diagnosis not present

## 2022-01-18 DIAGNOSIS — E559 Vitamin D deficiency, unspecified: Secondary | ICD-10-CM | POA: Diagnosis not present

## 2022-03-06 ENCOUNTER — Other Ambulatory Visit: Payer: Self-pay | Admitting: Cardiovascular Disease

## 2022-03-18 ENCOUNTER — Other Ambulatory Visit: Payer: Self-pay

## 2022-03-18 MED ORDER — ATORVASTATIN CALCIUM 40 MG PO TABS: 40.0000 mg | ORAL_TABLET | Freq: Every day | ORAL | 1 refills | Status: DC

## 2022-04-18 DIAGNOSIS — M25562 Pain in left knee: Secondary | ICD-10-CM | POA: Diagnosis not present

## 2022-04-20 ENCOUNTER — Other Ambulatory Visit: Payer: Self-pay | Admitting: Orthopedic Surgery

## 2022-04-20 DIAGNOSIS — M25562 Pain in left knee: Secondary | ICD-10-CM

## 2022-04-27 ENCOUNTER — Ambulatory Visit
Admission: RE | Admit: 2022-04-27 | Discharge: 2022-04-27 | Disposition: A | Discharge status: Home / Self Care | Payer: Medicare Other | Source: Ambulatory Visit | Attending: Orthopedic Surgery | Admitting: Orthopedic Surgery

## 2022-04-27 DIAGNOSIS — M25562 Pain in left knee: Secondary | ICD-10-CM | POA: Diagnosis not present

## 2022-05-03 DIAGNOSIS — S83242A Other tear of medial meniscus, current injury, left knee, initial encounter: Secondary | ICD-10-CM | POA: Diagnosis not present

## 2022-05-11 DIAGNOSIS — X58XXXA Exposure to other specified factors, initial encounter: Secondary | ICD-10-CM | POA: Diagnosis not present

## 2022-05-11 DIAGNOSIS — M94262 Chondromalacia, left knee: Secondary | ICD-10-CM | POA: Diagnosis not present

## 2022-05-11 DIAGNOSIS — G8918 Other acute postprocedural pain: Secondary | ICD-10-CM | POA: Diagnosis not present

## 2022-05-11 DIAGNOSIS — Y999 Unspecified external cause status: Secondary | ICD-10-CM | POA: Diagnosis not present

## 2022-05-11 DIAGNOSIS — S83232A Complex tear of medial meniscus, current injury, left knee, initial encounter: Secondary | ICD-10-CM | POA: Diagnosis not present

## 2022-05-11 DIAGNOSIS — M659 Synovitis and tenosynovitis, unspecified: Secondary | ICD-10-CM | POA: Diagnosis not present

## 2022-05-11 DIAGNOSIS — M6752 Plica syndrome, left knee: Secondary | ICD-10-CM | POA: Diagnosis not present

## 2022-05-17 DIAGNOSIS — Z9889 Other specified postprocedural states: Secondary | ICD-10-CM | POA: Diagnosis not present

## 2022-05-17 DIAGNOSIS — M25662 Stiffness of left knee, not elsewhere classified: Secondary | ICD-10-CM | POA: Diagnosis not present

## 2022-06-21 DIAGNOSIS — K080 Exfoliation of teeth due to systemic causes: Secondary | ICD-10-CM | POA: Diagnosis not present

## 2022-07-19 DIAGNOSIS — R7303 Prediabetes: Secondary | ICD-10-CM | POA: Diagnosis not present

## 2022-07-19 DIAGNOSIS — E785 Hyperlipidemia, unspecified: Secondary | ICD-10-CM | POA: Diagnosis not present

## 2022-07-19 DIAGNOSIS — E663 Overweight: Secondary | ICD-10-CM | POA: Diagnosis not present

## 2022-07-19 DIAGNOSIS — L570 Actinic keratosis: Secondary | ICD-10-CM | POA: Diagnosis not present

## 2022-07-19 DIAGNOSIS — N182 Chronic kidney disease, stage 2 (mild): Secondary | ICD-10-CM | POA: Diagnosis not present

## 2022-07-19 DIAGNOSIS — M109 Gout, unspecified: Secondary | ICD-10-CM | POA: Diagnosis not present

## 2022-07-19 DIAGNOSIS — Z125 Encounter for screening for malignant neoplasm of prostate: Secondary | ICD-10-CM | POA: Diagnosis not present

## 2022-07-19 DIAGNOSIS — I129 Hypertensive chronic kidney disease with stage 1 through stage 4 chronic kidney disease, or unspecified chronic kidney disease: Secondary | ICD-10-CM | POA: Diagnosis not present

## 2022-07-26 DIAGNOSIS — E785 Hyperlipidemia, unspecified: Secondary | ICD-10-CM | POA: Diagnosis not present

## 2022-07-26 DIAGNOSIS — Z0000 Encounter for general adult medical examination without abnormal findings: Secondary | ICD-10-CM | POA: Diagnosis not present

## 2022-07-26 DIAGNOSIS — N182 Chronic kidney disease, stage 2 (mild): Secondary | ICD-10-CM | POA: Diagnosis not present

## 2022-07-26 DIAGNOSIS — E559 Vitamin D deficiency, unspecified: Secondary | ICD-10-CM | POA: Diagnosis not present

## 2022-07-26 DIAGNOSIS — M109 Gout, unspecified: Secondary | ICD-10-CM | POA: Diagnosis not present

## 2022-09-01 NOTE — Progress Notes (Signed)
Cardiology Clinic Note   Patient Name: Sean Welch Date of Encounter: 09/05/2022  Primary Care Provider:  Irena Reichmann, DO Primary Cardiologist:  Thurmon Fair, MD  Patient Profile    Sean Welch 72 year old male presents to the clinic today for follow-up evaluation of his coronary artery disease and essential hypertension.  Past Medical History    Past Medical History:  Diagnosis Date   PFO (patent foramen ovale)    repair 02/2005   Systemic hypertension    TIA (transient ischemic attack)    Past Surgical History:  Procedure Laterality Date   NM MYOCAR PERF WALL MOTION  04/01/2008   Normal   PATENT FORAMEN OVALE CLOSURE  02/11/2005   SHOULDER ARTHROSCOPY     TEE WITHOUT CARDIOVERSION  02/11/2005   US ECHOCARDIOGRAPHY  10/19/2011   mild concentric LVH,mechanical PFO closure device, MV leaflet valve thickening    Allergies  No Known Allergies  History of Present Illness    SEAN LUPTON has a PMH of essential hypertension, HLD, overweight, CAD, and is status post PFO closure device 2006.  He had remote TIA which was felt to be secondary to paradoxical embolism.  Coronary CTA 1/21 showed moderate nonobstructive proximal LAD 50% with FFR greater than 0.9.  Calcium score was 293 which placed him in the 71st percentile.  Echocardiogram 2/20 showed an LVEF of 55-60%, intermediate diastolic parameters, and septal closure device with no evidence of shunt.  He was seen in follow-up by Dr. Royann Shivers on 09/09/2020.  During that time he complained of bilateral hand pain, wrist, thumbs, and dorsal aspect of his hands.  He reported this was worse with playing golf.  He saw a rheumatologist who diagnosed osteoarthritis.  He denied chest discomfort and chest pain with exertion, palpitations, lower extremity edema, weight gain, hemoptysis and cough.  His LDL was noted to be 122 which was down to 60 with statin therapy.  He noted that his parents had moved to [place] and  were living next-door to his sister.  He presents to the clinic today for follow-up evaluation and states he continues to be very physically active riding his bike, swimming, and maintaining his property.  His blood pressure initially today is noted to be 144/74 and on recheck is 130/66.  His blood pressure at home is well-controlled.  He retired about 8 months ago and his wife also retired about 1 year ago.  They are enjoying pulling their travel to regular around, going to different locations to visit family, and throughout [place].  He does note that he underwent knee arthroscopy a few weeks ago due to meniscus tear.  He is not having any knee pain.  He continues to have pain in his hands and wrists due to his osteoarthritis.  We will plan follow-up in 12 months.  I will have him continue his current medication regimen and diet.  Today he denies chest pain, shortness of breath, lower extremity edema, fatigue, palpitations, melena, hematuria, hemoptysis, diaphoresis, weakness, presyncope, syncope, orthopnea, and PND.     Home Medications    Prior to Admission medications   Medication Sig Start Date End Date Taking? Authorizing Provider  allopurinol (ZYLOPRIM) 100 MG tablet Take 100 mg by mouth daily. 03/16/17   [provider]  aspirin 81 MG tablet Take 81 mg by mouth daily.    [provider]  atorvastatin (LIPITOR) 40 MG tablet Take 1 tablet (40 mg total) by mouth daily. 03/18/22   Croitoru, De Pere,  MD  colchicine 0.6 MG tablet Take 0.6 mg by mouth daily as needed. 01/23/17   [provider]  famotidine (PEPCID) 20 MG tablet Take 20 mg by mouth daily.    [provider]  metoprolol tartrate (LOPRESSOR) 100 MG tablet Take within two hours of the test 03/11/19   Croitoru, Mihai, MD  Multiple Vitamin (MULTIVITAMIN) tablet Take 1 tablet by mouth daily.    [provider]  tadalafil (CIALIS) 5 MG tablet Take 5 mg by mouth daily. 03/08/17   [provider]  tamsulosin (FLOMAX) 0.4 MG CAPS capsule Take 0.4 mg by mouth daily.    [provider]    Family History    Family History  Problem Relation Age of Onset   Heart attack Mother    Cancer Mother    Hypertension Mother    Healthy Father    Healthy Sister    Healthy Sister    Healthy Child    Healthy Child    He indicated that his mother is alive. He indicated that his father is alive. He indicated that both of his sisters are alive. He indicated that both of his children are alive.  Social History    Social History   Socioeconomic History   Marital status: Married    Spouse name: Not on file   Number of children: Not on file   Years of education: Not on file   Highest education level: Not on file  Occupational History   Not on file  Tobacco Use   Smoking status: Never   Smokeless tobacco: Never  Vaping Use   Vaping Use: Never used  Substance and Sexual Activity   Alcohol use: Yes    Comment: couple times a month   Drug use: No   Sexual activity: Not on file  Other Topics Concern   Not on file  Social History Narrative   Not on file   Social Determinants of Health   Financial Resource Strain: Not on file  Food Insecurity: Not on file  Transportation Needs: Not on file  Physical Activity: Not on file  Stress: Not on file  Social Connections: Not on file  Intimate Partner Violence: Not on file     Review of Systems    General:  No chills, fever, night sweats or weight changes.  Cardiovascular:  No chest pain, dyspnea on exertion, edema, orthopnea, palpitations, paroxysmal nocturnal dyspnea. Dermatological: No rash, lesions/masses Respiratory: No cough, dyspnea Urologic: No hematuria, dysuria Abdominal:   No nausea, vomiting, diarrhea, bright red blood per rectum, melena, or hematemesis Neurologic:  No visual changes, wkns, changes in mental status. All other systems reviewed and are otherwise negative except as noted  above.  Physical Exam    VS:  BP 130/66   Pulse 72   Ht 5' 10.5" (1.791 m)   Wt 211 lb (95.7 kg)   SpO2 95%   BMI 29.85 kg/m  , BMI Body mass index is 29.85 kg/m. GEN: Well nourished, well developed, in no acute distress. HEENT: normal. Neck: Supple, no JVD, carotid bruits, or masses. Cardiac: RRR, no murmurs, rubs, or gallops. No clubbing, cyanosis, edema.  Radials/DP/PT 2+ and equal bilaterally.  Respiratory:  Respirations regular and unlabored, clear to auscultation bilaterally. GI: Soft, nontender, nondistended, BS + x 4. MS: no deformity or atrophy. Skin: warm and dry, no rash. Neuro:  Strength and sensation are intact. Psych: Normal affect.  Accessory Clinical Findings    Recent Labs: No results found  for requested labs within last 365 days.   Recent Lipid Panel    Component Value Date/Time   CHOL  03/24/2008 0432    182        ATP III CLASSIFICATION:  <200     mg/dL   Desirable  161-096  mg/dL   Borderline High  >=045    mg/dL   High          TRIG 409 03/24/2008 0432   HDL 36 (L) 03/24/2008 0432   CHOLHDL 5.1 03/24/2008 0432   VLDL 24 03/24/2008 0432   LDLCALC (H) 03/24/2008 0432    122        Total Cholesterol/HDL:CHD Risk Coronary Heart Disease Risk Table                     Men   Women  1/2 Average Risk   3.4   3.3  Average Risk       5.0   4.4  2 X Average Risk   9.6   7.1  3 X Average Risk  23.4   11.0        Use the calculated Patient Ratio above and the CHD Risk Table to determine the patient's CHD Risk.        ATP III CLASSIFICATION (LDL):  <100     mg/dL   Optimal  811-914  mg/dL   Near or Above                    Optimal  130-159  mg/dL   Borderline  782-956  mg/dL   High  >213     mg/dL   Very High         ECG personally reviewed by me today-normal sinus rhythm right bundle branch block left anterior fascicular block 72 bpm  Echocardiogram 04/16/2018  1. The left ventricle has normal systolic function of 55-60%. The cavity size  was normal. There is no increased left ventricular wall thickness. Left ventricular diastolic parameters were indeterminate.  2. No evidence of left ventricular regional wall motion abnormalities.  3. The right ventricle has normal systolic function. The cavity was normal. There is no increase in right ventricular wall thickness.  4. Septal closure device is present. There is no evidence of shunt by color Doppler. Saline contrast study was not performed.  Coronary CTA, FFR 04/04/2019  FINDINGS: Limited view of the lung parenchyma demonstrates no suspicious nodularity. Airways are normal.   Limited view of the mediastinum demonstrates no adenopathy. Esophagus normal.   Limited view of the upper abdomen unremarkable.   Limited view of the skeleton and chest wall is unremarkable.   IMPRESSION: No significant extracardiac findings.     Electronically Signed   By: Genevive Bi M.D.   On: 04/05/2019 08:25    Addended by Lorna Few, MD on 04/05/2019  8:28 AM  ADDENDUM REPORT: 04/04/2019 17:16   ADDENDUM: Patient has an ASD septal occluder device in place. There was no evidence for atrial septal shunt.     Electronically Signed   By: Debbe Odea M.D.   On: 04/04/2019 17:16    Addended by Debbe Odea, MD on 04/04/2019  5:19 PM    Study Result  Narrative & Impression  CLINICAL DATA:  Hx of chestpain   EXAM: Cardiac/Coronary  CTA   TECHNIQUE: The patient was scanned on a Siemens Somatoform go.Top scanner.   FINDINGS: A retrospective scan was triggered in the descending thoracic aorta. Axial  non-contrast 3 mm slices were carried out through the heart. The data set was analyzed on a dedicated work station and scored using the Agatson method. Gantry rotation speed was 330 msecs and collimation was .6 mm. 100mg  of metoprolol and 0.8 mg of sl NTG was given. The 3D data set was reconstructed in 5% intervals of the 60-95 % of the R-R cycle. Diastolic phases were  analyzed on a dedicated work station using MPR, MIP and VRT modes. The patient received 100 cc of contrast.   Aorta:  Normal size.  No calcifications.  No dissection.   Aortic Valve:  Trileaflet.  No calcifications.   Coronary Arteries:  Normal coronary origin.  Right dominance.   RCA is a large dominant artery that gives rise to PDA and PLA. There is no plaque or obstruction.   Left main is a large artery that gives rise to LAD and LCX arteries. LM is normal   LAD is a large vessel that has calcified and non calcified plaque in the proximal segment causing moderate (about 50%) stenosis.   LCX is a non-dominant artery that gives rise to a large OM1 branch. There is no plaque or obstruction.   Other findings:   Normal pulmonary vein drainage into the left atrium.   Normal left atrial appendage without a thrombus.   Normal size of the pulmonary artery.   IMPRESSION: 1. Coronary calcium score of 293. This was 71st percentile for age and sex matched control.   2. Normal coronary origin with right dominance.   3. Calcified and non calcified plaque in the proximal LAD causing moderate (50%) stenosis.   4. CAD-RADS 3. Moderate stenosis. Consider symptom-guided anti-ischemic pharmacotherapy as well as risk factor modification per guideline directed care. Additional analysis with CT FFR will be submitted and reported separately.   Electronically Signed: By: Debbe Odea M.D. On: 04/04/2019 14:15     FINDINGS: FFRct analysis was performed on the original cardiac CT angiogram dataset. Diagrammatic representation of the FFRct analysis is provided in a separate PDF document in PACS. This dictation was created using the PDF document and an interactive 3D model of the results. 3D model is not available in the EMR/PACS. Normal FFR range is >0.80.   1. Left Main:  No significant stenosis.   2. LAD: No significant stenosis. FFR>0.9 in the proxima and mid segments,0.88  in the distal segment 3. LCX: No significant stenosis. FFR >0.9 in the proximal segment, 0.85 in the LCx distal segment 4. RCA: No significant stenosis.  FFR 0.92   IMPRESSION: 1.  CT FFR analysis didn't show any significant stenosis.     Electronically Signed   By: Debbe Odea M.D.   On: 04/05/2019 11:11  Assessment & Plan   1.  Coronary artery disease-denies chest pain.  Underwent coronary CTA which showed a coronary calcium score of 293 placing him in the 71st percentile.  His FFR showed no significant disease and values greater than 0.9 and his LAD, circumflex and RCA. Continue current medical therapy Heart healthy low-sodium diet Increase physical activity as tolerated  Hyperlipidemia-LDL 60 on 07/19/22 Continue aspirin, atorvastatin High-fiber diet  TIA-neurologically intact.  Denies further episode of neurological deficits.  TIAs secondary  to paradoxical embolism. Continue aspirin, atorvastatin  ASD-status post PFO closure in 2006.  Echocardiogram 2/20 showed no evidence of shunt.  Details above. Repeat echocardiogram  Disposition: Follow-up with Dr. Royann Shivers or me in 12 months.   Thomasene Ripple. Sirenity Shew NP-C  09/05/2022, 4:14 PM [facility] [address] Office [phone] Fax [phone]    I spent 14 minutes examining this patient, reviewing medications, and using patient centered shared decision making involving her cardiac care.  Prior to her visit I spent greater than 20 minutes reviewing her past medical history,  medications, and prior cardiac tests.

## 2022-09-05 ENCOUNTER — Ambulatory Visit: Payer: Medicare Other | Admitting: General Practice

## 2022-09-05 ENCOUNTER — Encounter: Payer: Self-pay | Admitting: General Practice

## 2022-09-05 VITALS — BP 130/66 | HR 72 | Ht 70.5 in | Wt 211.0 lb

## 2022-09-05 DIAGNOSIS — Z8774 Personal history of (corrected) congenital malformations of heart and circulatory system: Secondary | ICD-10-CM

## 2022-09-05 DIAGNOSIS — E782 Mixed hyperlipidemia: Secondary | ICD-10-CM | POA: Diagnosis not present

## 2022-09-05 DIAGNOSIS — G459 Transient cerebral ischemic attack, unspecified: Secondary | ICD-10-CM

## 2022-09-05 DIAGNOSIS — I2510 Atherosclerotic heart disease of native coronary artery without angina pectoris: Secondary | ICD-10-CM | POA: Diagnosis not present

## 2022-09-05 DIAGNOSIS — I10 Essential (primary) hypertension: Secondary | ICD-10-CM | POA: Diagnosis not present

## 2022-09-05 NOTE — Patient Instructions (Addendum)
  Medication Instructions:  Your physician recommends that you continue on your current medications as directed. Please refer to the Current Medication list given to you today.  *If you need a refill on your cardiac medications before your next appointment, please call your pharmacy*   Lab Work: none If you have labs (blood work) drawn today and your tests are completely normal, you will receive your results only by: MyChart Message (if you have MyChart) OR A paper copy in the mail If you have any lab test that is abnormal or we need to change your treatment, we will call you to review the results.   Testing/Procedures: none   Follow-Up: At [facility], you and your health needs are our priority.  As part of our continuing mission to provide you with exceptional heart care, we have created designated Provider Care Teams.  These Care Teams include your primary Cardiologist (physician) and Advanced Practice Providers (APPs -  Physician Assistants and Nurse Practitioners) who all work together to provide you with the care you need, when you need it.  We recommend signing up for the patient portal called "MyChart".  Sign up information is provided on this After Visit Summary.  MyChart is used to connect with patients for Virtual Visits (Telemedicine).  Patients are able to view lab/test results, encounter notes, upcoming appointments, etc.  Non-urgent messages can be sent to your provider as well.   To learn more about what you can do with MyChart, go to [email or web address].    Your next appointment:   1 year(s)  Provider:   Edd Fabian, FNP

## 2022-10-15 ENCOUNTER — Other Ambulatory Visit: Payer: Self-pay | Admitting: Cardiovascular Disease

## 2022-10-18 ENCOUNTER — Other Ambulatory Visit: Payer: Self-pay | Admitting: Orthopedic Surgery

## 2022-10-18 DIAGNOSIS — M180 Bilateral primary osteoarthritis of first carpometacarpal joints: Secondary | ICD-10-CM | POA: Diagnosis not present

## 2022-10-19 ENCOUNTER — Telehealth: Payer: Self-pay | Admitting: Cardiovascular Disease

## 2022-10-19 NOTE — Telephone Encounter (Signed)
   Pre-operative Risk Assessment    Patient Name: Sean Welch  DOB: 03-15-1950 MRN: [id number]      Request for Surgical Clearance    Procedure:   Left Thumb Carpometacartal Suspension Plasty  withTrapeziectomy  Date of Surgery:  Clearance 12/01/22                                 Surgeon:  Dr. [name] Surgeon's Group or Practice Name:  [facility] Phone number:  [phone] Fax number:  [phone]   Type of Clearance Requested:   - Pharmacy:  Hold Aspirin How many days  needs to be held?   Type of Anesthesia:   Choice   Additional requests/questions:  Please advise surgeon/provider what medications should be held.  Signed, Belisicia T Woods   10/19/2022, 2:49 PM

## 2022-10-20 NOTE — Telephone Encounter (Signed)
I will fax notes to requesting office to please see notes from Edd Fabian, FNP.

## 2022-10-20 NOTE — Telephone Encounter (Signed)
Preoperative team, please contact requesting office and let them know that TIA management should be advised by PCP.  I will defer recommendations to aspirin recommendations to PCP or neurology provider.  Thank you.  Thomasene Ripple.  NP-C     10/20/2022, 12:55 PM [facility] [address] Office [phone] Fax [phone]

## 2022-10-20 NOTE — Telephone Encounter (Signed)
[facility] is calling to state that the aspirin was prescribed by patient's previous doctors and it seems that this location is now closed. Requesting advisement on this medication.

## 2022-10-20 NOTE — Telephone Encounter (Signed)
     Primary Cardiologist: Thurmon Fair, MD  Chart reviewed as part of pre-operative protocol coverage. Given past medical history and time since last visit, based on ACC/AHA guidelines, Sean Welch would be at acceptable risk for the planned procedure without further cardiovascular testing.   His aspirin is not prescribed by cardiology provider.  He has history of TIA.  Recommendations for holding aspirin will need to come from prescribing provider.  His RCRI is a class III risk, 6.6% risk of major cardiac event.  He is able to complete greater than 4 METS of physical activity.  I will route this recommendation to the requesting party via Epic fax function and remove from pre-op pool.  Please call with questions.  Thomasene Ripple.  NP-C     10/20/2022, 8:13 AM [facility] [address] Office [phone] Fax [phone]

## 2022-11-01 ENCOUNTER — Other Ambulatory Visit: Payer: Self-pay

## 2022-11-01 ENCOUNTER — Encounter (HOSPITAL_BASED_OUTPATIENT_CLINIC_OR_DEPARTMENT_OTHER): Payer: Self-pay | Admitting: Orthopedic Surgery

## 2022-11-09 NOTE — Anesthesia Preprocedure Evaluation (Signed)
Anesthesia Evaluation  Patient identified by MRN, date of birth, ID band Patient awake    Reviewed: Allergy & Precautions, NPO status , Patient's Chart, lab work & pertinent test results  Airway Mallampati: II  TM Distance: >3 FB Neck ROM: Full    Dental no notable dental hx. (+) Teeth Intact, Dental Advisory Given   Pulmonary    Pulmonary exam normal breath sounds clear to auscultation       Cardiovascular Exercise Tolerance: Good Normal cardiovascular exam Rhythm:Regular Rate:Normal  Nl EF   Neuro/Psych TIA (2006  PFO)   GI/Hepatic ,GERD  ,,  Endo/Other    Renal/GU      Musculoskeletal  (+) Arthritis , Osteoarthritis,    Abdominal   Peds  Hematology   Anesthesia Other Findings   Reproductive/Obstetrics                             Anesthesia Physical Anesthesia Plan  ASA: 2  Anesthesia Plan: Regional   Post-op Pain Management: Tylenol PO (pre-op)*   Induction:   PONV Risk Score and Plan: 1 and Treatment may vary due to age or medical condition, Midazolam and Propofol infusion  Airway Management Planned: Natural Airway and Nasal Cannula  Additional Equipment: None  Intra-op Plan:   Post-operative Plan:   Informed Consent: I have reviewed the patients History and Physical, chart, labs and discussed the procedure including the risks, benefits and alternatives for the proposed anesthesia with the patient or authorized representative who has indicated his/her understanding and acceptance.     Dental advisory given  Plan Discussed with: CRNA  Anesthesia Plan Comments: (L supra clav)        Anesthesia Quick Evaluation

## 2022-11-10 ENCOUNTER — Other Ambulatory Visit: Payer: Self-pay

## 2022-11-10 ENCOUNTER — Encounter (HOSPITAL_BASED_OUTPATIENT_CLINIC_OR_DEPARTMENT_OTHER): Payer: Self-pay | Admitting: Orthopedic Surgery

## 2022-11-10 ENCOUNTER — Ambulatory Visit (HOSPITAL_BASED_OUTPATIENT_CLINIC_OR_DEPARTMENT_OTHER): Payer: Medicare Other

## 2022-11-10 ENCOUNTER — Ambulatory Visit (HOSPITAL_BASED_OUTPATIENT_CLINIC_OR_DEPARTMENT_OTHER)
Admission: RE | Admit: 2022-11-10 | Discharge: 2022-11-10 | Disposition: A | Discharge status: Home / Self Care | Payer: Medicare Other | Attending: Orthopedic Surgery | Admitting: Orthopedic Surgery

## 2022-11-10 ENCOUNTER — Encounter (HOSPITAL_BASED_OUTPATIENT_CLINIC_OR_DEPARTMENT_OTHER)
Admission: RE | Disposition: A | Discharge status: Home / Self Care | Payer: Self-pay | Source: Home / Self Care | Attending: Orthopedic Surgery

## 2022-11-10 ENCOUNTER — Ambulatory Visit (HOSPITAL_BASED_OUTPATIENT_CLINIC_OR_DEPARTMENT_OTHER): Payer: Medicare Other | Admitting: Anesthesiology

## 2022-11-10 DIAGNOSIS — Z8673 Personal history of transient ischemic attack (TIA), and cerebral infarction without residual deficits: Secondary | ICD-10-CM | POA: Insufficient documentation

## 2022-11-10 DIAGNOSIS — M1812 Unilateral primary osteoarthritis of first carpometacarpal joint, left hand: Secondary | ICD-10-CM | POA: Diagnosis not present

## 2022-11-10 DIAGNOSIS — M189 Osteoarthritis of first carpometacarpal joint, unspecified: Secondary | ICD-10-CM | POA: Diagnosis not present

## 2022-11-10 DIAGNOSIS — K219 Gastro-esophageal reflux disease without esophagitis: Secondary | ICD-10-CM | POA: Insufficient documentation

## 2022-11-10 DIAGNOSIS — Z01818 Encounter for other preprocedural examination: Secondary | ICD-10-CM

## 2022-11-10 HISTORY — PX: TENDON TRANSFER: SHX6109

## 2022-11-10 HISTORY — DX: Cerebral infarction, unspecified: I63.9

## 2022-11-10 HISTORY — PX: CARPOMETACARPEL SUSPENSION PLASTY: SHX5005

## 2022-11-10 HISTORY — DX: Gastro-esophageal reflux disease without esophagitis: K21.9

## 2022-11-10 SURGERY — CARPOMETACARPEL (CMC) SUSPENSION PLASTY
Anesthesia: Regional | Site: Thumb | Laterality: Left

## 2022-11-10 MED ORDER — BUPIVACAINE HCL (PF) 0.5 % IJ SOLN
INTRAMUSCULAR | Status: DC | PRN
  Administered 2022-11-10: 10 mL via PERINEURAL

## 2022-11-10 MED ORDER — ACETAMINOPHEN 10 MG/ML IV SOLN: 1000.0000 mg | Freq: Once | INTRAVENOUS | Status: DC | PRN

## 2022-11-10 MED ORDER — LIDOCAINE-EPINEPHRINE (PF) 1.5 %-1:200000 IJ SOLN
INTRAMUSCULAR | Status: DC | PRN
Start: 2022-11-10 — End: 2022-11-10
  Administered 2022-11-10: 10 mL via PERINEURAL

## 2022-11-10 MED ORDER — LACTATED RINGERS IV SOLN: INTRAVENOUS | Status: DC

## 2022-11-10 MED ORDER — ONDANSETRON HCL 4 MG/2ML IJ SOLN: 4.0000 mg | Freq: Once | INTRAMUSCULAR | Status: DC | PRN

## 2022-11-10 MED ORDER — CEFAZOLIN SODIUM-DEXTROSE 2-4 GM/100ML-% IV SOLN
2.0000 g | INTRAVENOUS | Status: AC
  Administered 2022-11-10: 2 g via INTRAVENOUS

## 2022-11-10 MED ORDER — FENTANYL CITRATE (PF) 100 MCG/2ML IJ SOLN: 25.0000 ug | INTRAMUSCULAR | Status: DC | PRN

## 2022-11-10 MED ORDER — FENTANYL CITRATE (PF) 100 MCG/2ML IJ SOLN
INTRAMUSCULAR | Status: AC
  Filled 2022-11-10: qty 2

## 2022-11-10 MED ORDER — 0.9 % SODIUM CHLORIDE (POUR BTL) OPTIME
TOPICAL | Status: DC | PRN
Start: 2022-11-10 — End: 2022-11-10
  Administered 2022-11-10: 200 mL

## 2022-11-10 MED ORDER — PROPOFOL 10 MG/ML IV BOLUS
INTRAVENOUS | Status: DC | PRN
  Administered 2022-11-10: 30 mg via INTRAVENOUS

## 2022-11-10 MED ORDER — CEFAZOLIN SODIUM-DEXTROSE 2-4 GM/100ML-% IV SOLN
INTRAVENOUS | Status: AC
  Filled 2022-11-10: qty 100

## 2022-11-10 MED ORDER — HYDROCODONE-ACETAMINOPHEN 5-325 MG PO TABS: ORAL_TABLET | ORAL | 0 refills | Status: AC

## 2022-11-10 MED ORDER — PROPOFOL 500 MG/50ML IV EMUL
INTRAVENOUS | Status: DC | PRN
  Administered 2022-11-10: 100 ug/kg/min via INTRAVENOUS

## 2022-11-10 MED ORDER — MIDAZOLAM HCL 2 MG/2ML IJ SOLN
2.0000 mg | Freq: Once | INTRAMUSCULAR | Status: AC
  Administered 2022-11-10: 2 mg via INTRAVENOUS

## 2022-11-10 MED ORDER — MIDAZOLAM HCL 2 MG/2ML IJ SOLN
INTRAMUSCULAR | Status: AC
  Filled 2022-11-10: qty 2

## 2022-11-10 SURGICAL SUPPLY — 84 items
APL PRP STRL LF DISP 70% ISPRP (MISCELLANEOUS) ×1
BAG DECANTER FOR FLEXI CONT (MISCELLANEOUS) IMPLANT
BALL CTTN LRG ABS STRL LF (GAUZE/BANDAGES/DRESSINGS)
BLADE MINI RND TIP GREEN BEAV (BLADE) ×1 IMPLANT
BLADE SURG 15 STRL LF DISP TIS (BLADE) ×2 IMPLANT
BLADE SURG 15 STRL SS (BLADE) ×2
BNDG CMPR 5X2 KNTD ELC UNQ LF (GAUZE/BANDAGES/DRESSINGS)
BNDG CMPR 5X3 KNIT ELC UNQ LF (GAUZE/BANDAGES/DRESSINGS) ×1
BNDG CMPR 9X4 STRL LF SNTH (GAUZE/BANDAGES/DRESSINGS) ×1
BNDG ELASTIC 2INX 5YD STR LF (GAUZE/BANDAGES/DRESSINGS) IMPLANT
BNDG ELASTIC 3INX 5YD STR LF (GAUZE/BANDAGES/DRESSINGS) ×1 IMPLANT
BNDG ESMARK 4X9 LF (GAUZE/BANDAGES/DRESSINGS) ×1 IMPLANT
BNDG GAUZE DERMACEA FLUFF 4 (GAUZE/BANDAGES/DRESSINGS) ×1 IMPLANT
BNDG GZE DERMACEA 4 6PLY (GAUZE/BANDAGES/DRESSINGS) ×1
CHLORAPREP W/TINT 26 (MISCELLANEOUS) ×1 IMPLANT
CORD BIPOLAR FORCEPS 12FT (ELECTRODE) ×1 IMPLANT
COTTONBALL LRG STERILE PKG (GAUZE/BANDAGES/DRESSINGS) IMPLANT
COVER BACK TABLE 60X90IN (DRAPES) ×1 IMPLANT
COVER MAYO STAND STRL (DRAPES) ×1 IMPLANT
CUFF TOURN SGL QUICK 18X4 (TOURNIQUET CUFF) ×1 IMPLANT
DRAPE EXTREMITY T 121X128X90 (DISPOSABLE) ×1 IMPLANT
DRAPE OEC MINIVIEW 54X84 (DRAPES) ×1 IMPLANT
DRAPE SURG 17X23 STRL (DRAPES) ×1 IMPLANT
GAUZE 4X4 16PLY ~~LOC~~+RFID DBL (SPONGE) IMPLANT
GAUZE PAD ABD 8X10 STRL (GAUZE/BANDAGES/DRESSINGS) IMPLANT
GAUZE SPONGE 4X4 12PLY STRL (GAUZE/BANDAGES/DRESSINGS) ×1 IMPLANT
GAUZE XEROFORM 1X8 LF (GAUZE/BANDAGES/DRESSINGS) ×1 IMPLANT
GLOVE BIO SURGEON STRL SZ7.5 (GLOVE) ×1 IMPLANT
GLOVE BIOGEL PI IND STRL 8 (GLOVE) ×1 IMPLANT
GLOVE BIOGEL PI IND STRL 8.5 (GLOVE) IMPLANT
GLOVE SURG ORTHO 8.0 STRL STRW (GLOVE) IMPLANT
GOWN STRL REUS W/ TWL LRG LVL3 (GOWN DISPOSABLE) ×1 IMPLANT
GOWN STRL REUS W/TWL LRG LVL3 (GOWN DISPOSABLE) ×1
GOWN STRL REUS W/TWL XL LVL3 (GOWN DISPOSABLE) ×1 IMPLANT
KIT BUTTON SUT MICROLINK LP (Anchor) IMPLANT
LOOP VASCLR MAXI BLUE 18IN ST (MISCELLANEOUS) IMPLANT
LOOP VASCULAR MAXI 18 BLUE (MISCELLANEOUS)
NDL HYPO 25X1 1.5 SAFETY (NEEDLE) IMPLANT
NDL KEITH (NEEDLE) IMPLANT
NDL SAFETY ECLIP 18X1.5 (MISCELLANEOUS) IMPLANT
NEEDLE HYPO 25X1 1.5 SAFETY (NEEDLE)
NEEDLE KEITH (NEEDLE)
NS IRRIG 1000ML POUR BTL (IV SOLUTION) ×1 IMPLANT
PACK BASIN DAY SURGERY FS (CUSTOM PROCEDURE TRAY) ×1 IMPLANT
PAD CAST 3X4 CTTN HI CHSV (CAST SUPPLIES) ×1 IMPLANT
PAD CAST 4YDX4 CTTN HI CHSV (CAST SUPPLIES) IMPLANT
PADDING CAST ABS COTTON 3X4 (CAST SUPPLIES) IMPLANT
PADDING CAST ABS COTTON 4X4 ST (CAST SUPPLIES) ×1 IMPLANT
PADDING CAST COTTON 3X4 STRL (CAST SUPPLIES) ×1
PADDING CAST COTTON 4X4 STRL (CAST SUPPLIES)
SLEEVE SCD COMPRESS KNEE MED (STOCKING) IMPLANT
SPIKE FLUID TRANSFER (MISCELLANEOUS) IMPLANT
SPLINT PLASTER CAST FAST 5X30 (CAST SUPPLIES) IMPLANT
SPLINT PLASTER CAST XFAST 3X15 (CAST SUPPLIES) IMPLANT
SPLINT PLASTER CAST XFAST 4X15 (CAST SUPPLIES) IMPLANT
STOCKINETTE 4X48 STRL (DRAPES) ×1 IMPLANT
SUT CHROMIC 5 0 P 3 (SUTURE) IMPLANT
SUT ETHIBOND 3-0 V-5 (SUTURE) IMPLANT
SUT ETHILON 3 0 PS 1 (SUTURE) IMPLANT
SUT ETHILON 4 0 PS 2 18 (SUTURE) ×1 IMPLANT
SUT FIBERWIRE 2-0 18 17.9 3/8 (SUTURE) ×1
SUT FIBERWIRE 3-0 18 TAPR NDL (SUTURE)
SUT FIBERWIRE 4-0 18 DIAM BLUE (SUTURE)
SUT MERSILENE 2.0 SH NDLE (SUTURE) IMPLANT
SUT MERSILENE 4 0 P 3 (SUTURE) IMPLANT
SUT PROLENE 2 0 SH DA (SUTURE) IMPLANT
SUT PROLENE 6 0 P 1 18 (SUTURE) IMPLANT
SUT SILK 2 0 PERMA HAND 18 BK (SUTURE) IMPLANT
SUT SILK 4 0 PS 2 (SUTURE) IMPLANT
SUT VIC AB 3-0 PS1 18 (SUTURE)
SUT VIC AB 3-0 PS1 18XBRD (SUTURE) IMPLANT
SUT VIC AB 4-0 P-3 18XBRD (SUTURE) IMPLANT
SUT VIC AB 4-0 P3 18 (SUTURE)
SUT VIC AB 4-0 PS2 18 (SUTURE) ×1 IMPLANT
SUT VICRYL 0 SH 27 (SUTURE) IMPLANT
SUTURE FIBERWR 2-0 18 17.9 3/8 (SUTURE) ×1 IMPLANT
SUTURE FIBERWR 3-0 18 TAPR NDL (SUTURE) IMPLANT
SUTURE FIBERWR 4-0 18 DIA BLUE (SUTURE) IMPLANT
SYR BULB EAR ULCER 3OZ GRN STR (SYRINGE) ×1 IMPLANT
SYR CONTROL 10ML LL (SYRINGE) IMPLANT
TOWEL GREEN STERILE FF (TOWEL DISPOSABLE) ×2 IMPLANT
TUBE NG 5FR 35IN ENFIT (TUBING) IMPLANT
UNDERPAD 30X36 HEAVY ABSORB (UNDERPADS AND DIAPERS) ×1 IMPLANT
VASCULAR TIE MAXI BLUE 18IN ST (MISCELLANEOUS)

## 2022-11-10 NOTE — H&P (Signed)
Sean Welch is an 72 y.o. male.   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  He has tired non operative measures without lasting relief.  He wishes to proceed with trapeziectomy and suspensionplasty.  Allergies: No Known Allergies  Past Medical History:  Diagnosis Date   GERD (gastroesophageal reflux disease)    PFO (patent foramen ovale)    repair 02/2005   Stroke (HCC)    TIA-no deficits   Systemic hypertension    TIA (transient ischemic attack)     Past Surgical History:  Procedure Laterality Date   NM MYOCAR PERF WALL MOTION  04/01/2008   Normal   PATENT FORAMEN OVALE CLOSURE  02/11/2005   SHOULDER ARTHROSCOPY     TEE WITHOUT CARDIOVERSION  02/11/2005   US ECHOCARDIOGRAPHY  10/19/2011   mild concentric LVH,mechanical PFO closure device, MV leaflet valve thickening    Family History: Family History  Problem Relation Age of Onset   Heart attack Mother    Cancer Mother    Hypertension Mother    Healthy Father    Healthy Sister    Healthy Sister    Healthy Child    Healthy Child     Social History:   reports that he has never smoked. He has never used smokeless tobacco. He reports that he does not currently use alcohol. He reports that he does not use drugs.  Medications: Medications Prior to Admission  Medication Sig Dispense Refill   aspirin 81 MG tablet Take 81 mg by mouth daily.     atorvastatin (LIPITOR) 40 MG tablet TAKE 1 TABLET BY MOUTH DAILY 30 tablet 11   Elderberry 500 MG CAPS Take 500 mg by mouth daily at 6 (six) AM.     famotidine (PEPCID) 20 MG tablet Take 20 mg by mouth daily.     Multiple Vitamin (MULTIVITAMIN) tablet Take 1 tablet by mouth daily.     tamsulosin (FLOMAX) 0.4 MG CAPS capsule Take 0.4 mg by mouth daily.     Vitamin D-Vitamin K (VITAMIN K2-VITAMIN D3 PO) Take 1 tablet by mouth daily.     colchicine 0.6 MG tablet Take 0.6 mg by mouth daily as needed.  2   tadalafil (CIALIS) 5 MG tablet Take 5 mg  by mouth daily.  1    No results found for this or any previous visit (from the past 48 hour(s)).  No results found.    Blood pressure 125/78, pulse 68, temperature 98.1 F (36.7 C), temperature source Temporal, resp. rate 18, height 5\' 10"  (1.778 m), weight 94.8 kg, SpO2 100%.  General appearance: alert, cooperative, and appears stated age Head: Normocephalic, without obvious abnormality, atraumatic Neck: supple, symmetrical, trachea midline Extremities: Intact sensation and capillary refill all digits.  +epl/fpl/io.  No wounds.  Pulses: 2+ and symmetric Skin: Skin color, texture, turgor normal. No rashes or lesions Neurologic: Grossly normal Incision/Wound: none  Assessment/Plan Left thumb cmc arthritis.  Non operative and operative treatment options have been discussed with the patient and patient wishes to proceed with operative treatment. Plan trapeziectomy and suspensionplasty.  Risks, benefits and alternatives of surgery were discussed including risks of blood loss, infection, damage to nerves/vessels/tendons/ligament/bone, failure of surgery, need for additional surgery, complication with wound healing, stiffness.  He voiced understanding of these risks and elected to proceed.    [name] 11/10/2022, 9:33 AM

## 2022-11-10 NOTE — Transfer of Care (Signed)
Immediate Anesthesia Transfer of Care Note  Patient: Sean Welch  Procedure(s) Performed: LEFT THUMB TRAPEZIECTOMY AND SUSPENSIONPLASTY (Left: Thumb) TENDON TRANSFER (Left: Thumb)  Patient Location: PACU  Anesthesia Type:MAC  Level of Consciousness: awake and patient cooperative  Airway & Oxygen Therapy: Patient Spontanous Breathing and Patient connected to face mask oxygen  Post-op Assessment: Report given to RN and Post -op Vital signs reviewed and stable  Post vital signs: Reviewed and stable  Last Vitals:  Vitals Value Taken Time  BP 128/67 11/10/22 1215  Temp 36.2 C 11/10/22 1215  Pulse 60 11/10/22 1221  Resp 12 11/10/22 1221  SpO2 97 % 11/10/22 1221  Vitals shown include unfiled device data.  Last Pain:  Vitals:   11/10/22 0905  TempSrc: Temporal  PainSc: 4          Complications: No notable events documented.

## 2022-11-10 NOTE — Progress Notes (Signed)
Assisted Dr. Houser with left, supraclavicular, ultrasound guided block. Side rails up, monitors on throughout procedure. See vital signs in flow sheet. Tolerated Procedure well. 

## 2022-11-10 NOTE — Anesthesia Procedure Notes (Signed)
Anesthesia Regional Block: Supraclavicular block   Pre-Anesthetic Checklist: , timeout performed,  Correct Patient, Correct Site, Correct Laterality,  Correct Procedure, Correct Position, site marked,  Risks and benefits discussed,  Surgical consent,  Pre-op evaluation,  At surgeon's request and post-op pain management  Laterality: Upper and Left  Prep: chloraprep       Needles:  Injection technique: Single-shot  Needle Type: Echogenic Needle     Needle Length: 5cm  Needle Gauge: 21     Additional Needles:   Procedures:,,,, ultrasound used (permanent image in chart),,     Nerve Stimulator or Paresthesia:   Additional Responses:  Block tested.  Patient tolerated procedure well Narrative:  Start time: 11/10/2022 9:48 AM End time: 11/10/2022 9:56 AM Injection made incrementally with aspirations every 5 mL.  Performed by: Personally  Anesthesiologist: Trevor Iha, MD  Additional Notes: Block tested. Patient tolerated procedure well.

## 2022-11-10 NOTE — Discharge Instructions (Addendum)
 [facility] Center Instructions Hand Surgery  Wound Care: Keep your hand elevated above the level of your heart.  Do not allow it to dangle by your side.  Keep the dressing dry and do not remove it unless your doctor advises you to do so.  He will usually change it at the time of your post-op visit.  Moving your fingers is advised to stimulate circulation but will depend on the site of your surgery.  If you have a splint applied, your doctor will advise you regarding movement.  Activity: Do not drive or operate machinery today.  Rest today and then you may return to your normal activity and work as indicated by your physician.  Diet:  Drink liquids today or eat a light diet.  You may resume a regular diet tomorrow.    General expectations: Pain for two to three days. Fingers may become slightly swollen.  Call your doctor if any of the following occur: Severe pain not relieved by pain medication. Elevated temperature. Dressing soaked with blood. Inability to move fingers. White or bluish color to fingers.    Post Anesthesia Home Care Instructions  Activity: Get plenty of rest for the remainder of the day. A responsible individual must stay with you for 24 hours following the procedure.  For the next 24 hours, DO NOT: -Drive a car -Advertising copywriter -Drink alcoholic beverages -Take any medication unless instructed by your physician -Make any legal decisions or sign important papers.  Meals: Start with liquid foods such as gelatin or soup. Progress to regular foods as tolerated. Avoid greasy, spicy, heavy foods. If nausea and/or vomiting occur, drink only clear liquids until the nausea and/or vomiting subsides. Call your physician if vomiting continues.  Special Instructions/Symptoms: Your throat may feel dry or sore from the anesthesia or the breathing tube placed in your throat during surgery. If this causes discomfort, gargle with warm salt water. The discomfort should disappear  within 24 hours.  If you had a scopolamine patch placed behind your ear for the management of post- operative nausea and/or vomiting:  1. The medication in the patch is effective for 72 hours, after which it should be removed.  Wrap patch in a tissue and discard in the trash. Wash hands thoroughly with soap and water. 2. You may remove the patch earlier than 72 hours if you experience unpleasant side effects which may include dry mouth, dizziness or visual disturbances. 3. Avoid touching the patch. Wash your hands with soap and water after contact with the patch.   Regional Anesthesia Blocks  1. You may not be able to move or feel the "blocked" extremity after a regional anesthetic block. This may last may last from 3-48 hours after placement, but it will go away. The length of time depends on the medication injected and your individual response to the medication. As the nerves start to wake up, you may experience tingling as the movement and feeling returns to your extremity. If the numbness and inability to move your extremity has not gone away after 48 hours, please call your surgeon.   2. The extremity that is blocked will need to be protected until the numbness is gone and the strength has returned. Because you cannot feel it, you will need to take extra care to avoid injury. Because it may be weak, you may have difficulty moving it or using it. You may not know what position it is in without looking at it while the block is in effect.  3. For blocks in the legs and feet, returning to weight bearing and walking needs to be done carefully. You will need to wait until the numbness is entirely gone and the strength has returned. You should be able to move your leg and foot normally before you try and bear weight or walk. You will need someone to be with you when you first try to ensure you do not fall and possibly risk injury.  4. Bruising and tenderness at the needle site are common side effects  and will resolve in a few days.  5. Persistent numbness or new problems with movement should be communicated to the surgeon or the [facility] [phone] [facility] [phone]).

## 2022-11-10 NOTE — Anesthesia Procedure Notes (Signed)
Procedure Name: MAC Date/Time: 11/10/2022 11:03 AM  Performed by: Demetrio Lapping, CRNAPre-anesthesia Checklist: Patient identified, Emergency Drugs available, Suction available, Patient being monitored and Timeout performed Patient Re-evaluated:Patient Re-evaluated prior to induction Oxygen Delivery Method: Simple face mask Placement Confirmation: positive ETCO2 Dental Injury: Teeth and Oropharynx as per pre-operative assessment

## 2022-11-10 NOTE — Op Note (Signed)
I assisted Surgeons and Role:    * [name], MD - Primary    Cindee Salt, MD - Assisting on the Procedure(s): LEFT THUMB TRAPEZIECTOMY AND SUSPENSIONPLASTY TENDON TRANSFER on 11/10/2022.  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  Electronically signed by: Cindee Salt, MD Date: 11/10/2022 Time: 12:07 PM

## 2022-11-10 NOTE — Op Note (Signed)
NAME: Sean Welch MEDICAL RECORD NO: [id number] DATE OF BIRTH: 1950-06-19 FACILITY: [facility] LOCATION: [facility] PHYSICIAN: Tami Ribas, MD   OPERATIVE REPORT   DATE OF PROCEDURE: 11/10/22    PREOPERATIVE DIAGNOSIS: Left thumb CMC osteoarthritis   POSTOPERATIVE DIAGNOSIS: Left thumb CMC osteoarthritis   PROCEDURE: 1.  Left thumb trapeziectomy 2.  Left thumb suspensioplasty   SURGEON:  [name], M.D.   ASSISTANT: Cindee Salt, MD   ANESTHESIA:  Regional with sedation   INTRAVENOUS FLUIDS:  Per anesthesia flow sheet.   ESTIMATED BLOOD LOSS:  Minimal.   COMPLICATIONS:  None.   SPECIMENS:  none   TOURNIQUET TIME:    Total Tourniquet Time Documented: Upper Arm (Left) - 52 minutes Total: Upper Arm (Left) - 52 minutes    DISPOSITION:  Stable to PACU.   INDICATIONS: 72 year old male with left thumb CMC osteoarthritis.  He has tried nonoperative measures without lasting relief.  He wishes to proceed with trapeziectomy and suspension plasty.  Risks, benefits and alternatives of surgery were discussed including the risks of blood loss, infection, damage to nerves, vessels, tendons, ligaments, bone for surgery, need for additional surgery, complications with wound healing, continued pain, stiffness.  He voiced understanding of these risks and elected to proceed.  OPERATIVE COURSE:  After being identified preoperatively by myself,  the patient and I agreed on the procedure and site of the procedure.  The surgical site was marked.  Surgical consent had been signed. Preoperative IV antibiotic prophylaxis was given. He was transferred to the operating room and placed on the operating table in supine position with the left upper extremity on an arm board.  Sedation was induced by the anesthesiologist. A regional block had been performed by anesthesia in preoperative holding.    Left upper extremity was prepped and draped in normal sterile orthopedic fashion.  A  surgical pause was performed between the surgeons, anesthesia, and operating room staff and all were in agreement as to the patient, procedure, and site of procedure.  Tourniquet at the proximal aspect of the extremity was inflated to 250 mmHg after exsanguination of the arm with an Esmarch bandage.  Incision was made at the dorsum of the thumb over the [facility] joint.  This was carried into subcutaneous tissues by spreading technique.  Bipolar electrocautery is used to obtain hemostasis.  Branch of the superficial branch of the radial nerve was identified and protected.  The capsule was sharply incised.  The deep dorsal branch of the radial artery had been identified and was protected throughout the case.  The trapezium was cleared of Soft Tissue Attachments.  The knife and Therapist, nutritional were used to free up the trapezium.  It was then removed with the rongeurs.  The FCR tendon was placed under traction.  One half of the tendon was harvested creating a distally based stump.  The microleak suture anchor was then used.  The guidepin was placed through the base of the thumb metacarpal and across the index finger metacarpal.  C-arm was used in AP and lateral oblique projections to ensure appropriate position of the pin which was the case.  Incision was made on the dorsum of the hand to retrieve the end of the guidepin and the microleak suture anchor passed.  This was tied over the dorsum of the index metacarpal while placing a hemostat between the index and thumb metacarpals to prevent overtightening.  The dorsal wound was closed with 4-0 nylon in a horizontal mattress fashion.  The FCR tendon transfer was then passed through the APL tendon and back onto itself.  This was secured with FiberWire suture.  C-arm was used in AP lateral oblique projections to ensure appropriate suspension of the thumb metacarpal which was the case.  The wound was copiously irrigated with sterile saline.  The capsule was repaired with a 4-0  Vicryl suture.  Inverted interrupted 4-0 Vicryl sutures were placed in subcutaneous tissues and skin was closed with 4-0 nylon in a horizontal mattress fashion.  The wound was dressed with sterile Xeroform 4 x 4's and wrapped with a Kerlix bandage.  A thumb spica splint was placed and wrapped with Kerlix and Ace bandage.  The tourniquet was deflated at 52 minutes.  Fingertips were pink with brisk capillary refill after deflation of tourniquet.  The operative  drapes were broken down.  The patient was awoken from anesthesia safely.  He was transferred back to the stretcher and taken to PACU in stable condition.  I will see him back in the office in 1 week for postoperative followup.  I will give him a prescription for Norco 5/325 1-2 tabs PO q6 hours prn pain, dispense # 25.   [name], MD Electronically signed, 11/10/22

## 2022-11-11 NOTE — Anesthesia Postprocedure Evaluation (Signed)
Anesthesia Post Note  Patient: Sean Welch  Procedure(s) Performed: LEFT THUMB TRAPEZIECTOMY AND SUSPENSIONPLASTY (Left: Thumb) TENDON TRANSFER (Left: Thumb)     Patient location during evaluation: PACU Anesthesia Type: Regional and MAC Level of consciousness: awake and alert Pain management: pain level controlled Vital Signs Assessment: post-procedure vital signs reviewed and stable Respiratory status: spontaneous breathing, nonlabored ventilation, respiratory function stable and patient connected to nasal cannula oxygen Cardiovascular status: stable and blood pressure returned to baseline Postop Assessment: no apparent nausea or vomiting Anesthetic complications: no   No notable events documented.  Last Vitals:  Vitals:   11/10/22 1239 11/10/22 1304  BP: 135/79 (!) 154/90  Pulse: 67 68  Resp: 17 18  Temp:  (!) 36.3 C  SpO2: 96% 98%    Last Pain:  Vitals:   11/10/22 0905  TempSrc: Temporal                 Trevor Iha

## 2022-11-16 ENCOUNTER — Encounter (HOSPITAL_BASED_OUTPATIENT_CLINIC_OR_DEPARTMENT_OTHER): Payer: Self-pay | Admitting: Orthopedic Surgery

## 2022-11-18 DIAGNOSIS — M180 Bilateral primary osteoarthritis of first carpometacarpal joints: Secondary | ICD-10-CM | POA: Diagnosis not present

## 2022-11-18 DIAGNOSIS — M79642 Pain in left hand: Secondary | ICD-10-CM | POA: Diagnosis not present

## 2022-11-18 DIAGNOSIS — M25642 Stiffness of left hand, not elsewhere classified: Secondary | ICD-10-CM | POA: Diagnosis not present

## 2022-11-25 DIAGNOSIS — M180 Bilateral primary osteoarthritis of first carpometacarpal joints: Secondary | ICD-10-CM | POA: Diagnosis not present

## 2022-12-09 DIAGNOSIS — M79642 Pain in left hand: Secondary | ICD-10-CM | POA: Diagnosis not present

## 2022-12-09 DIAGNOSIS — M25642 Stiffness of left hand, not elsewhere classified: Secondary | ICD-10-CM | POA: Diagnosis not present

## 2022-12-09 DIAGNOSIS — M7989 Other specified soft tissue disorders: Secondary | ICD-10-CM | POA: Diagnosis not present

## 2022-12-23 DIAGNOSIS — M79642 Pain in left hand: Secondary | ICD-10-CM | POA: Diagnosis not present

## 2022-12-23 DIAGNOSIS — M7989 Other specified soft tissue disorders: Secondary | ICD-10-CM | POA: Diagnosis not present

## 2022-12-23 DIAGNOSIS — M25642 Stiffness of left hand, not elsewhere classified: Secondary | ICD-10-CM | POA: Diagnosis not present

## 2022-12-27 DIAGNOSIS — M19031 Primary osteoarthritis, right wrist: Secondary | ICD-10-CM | POA: Diagnosis not present

## 2022-12-27 DIAGNOSIS — M1812 Unilateral primary osteoarthritis of first carpometacarpal joint, left hand: Secondary | ICD-10-CM | POA: Diagnosis not present

## 2022-12-28 DIAGNOSIS — K080 Exfoliation of teeth due to systemic causes: Secondary | ICD-10-CM | POA: Diagnosis not present

## 2023-01-11 DIAGNOSIS — M25642 Stiffness of left hand, not elsewhere classified: Secondary | ICD-10-CM | POA: Diagnosis not present

## 2023-01-11 DIAGNOSIS — M7989 Other specified soft tissue disorders: Secondary | ICD-10-CM | POA: Diagnosis not present

## 2023-01-11 DIAGNOSIS — M79642 Pain in left hand: Secondary | ICD-10-CM | POA: Diagnosis not present

## 2023-02-08 DIAGNOSIS — L820 Inflamed seborrheic keratosis: Secondary | ICD-10-CM | POA: Diagnosis not present

## 2023-02-08 DIAGNOSIS — L570 Actinic keratosis: Secondary | ICD-10-CM | POA: Diagnosis not present

## 2023-03-06 DIAGNOSIS — M1812 Unilateral primary osteoarthritis of first carpometacarpal joint, left hand: Secondary | ICD-10-CM | POA: Diagnosis not present

## 2023-07-12 DIAGNOSIS — K080 Exfoliation of teeth due to systemic causes: Secondary | ICD-10-CM | POA: Diagnosis not present

## 2023-07-25 DIAGNOSIS — E559 Vitamin D deficiency, unspecified: Secondary | ICD-10-CM | POA: Diagnosis not present

## 2023-07-25 DIAGNOSIS — Z125 Encounter for screening for malignant neoplasm of prostate: Secondary | ICD-10-CM | POA: Diagnosis not present

## 2023-07-25 DIAGNOSIS — R7303 Prediabetes: Secondary | ICD-10-CM | POA: Diagnosis not present

## 2023-07-25 DIAGNOSIS — E785 Hyperlipidemia, unspecified: Secondary | ICD-10-CM | POA: Diagnosis not present

## 2023-07-25 DIAGNOSIS — M109 Gout, unspecified: Secondary | ICD-10-CM | POA: Diagnosis not present

## 2023-08-07 DIAGNOSIS — E559 Vitamin D deficiency, unspecified: Secondary | ICD-10-CM | POA: Diagnosis not present

## 2023-08-07 DIAGNOSIS — N182 Chronic kidney disease, stage 2 (mild): Secondary | ICD-10-CM | POA: Diagnosis not present

## 2023-08-07 DIAGNOSIS — Z0000 Encounter for general adult medical examination without abnormal findings: Secondary | ICD-10-CM | POA: Diagnosis not present

## 2023-08-07 DIAGNOSIS — E785 Hyperlipidemia, unspecified: Secondary | ICD-10-CM | POA: Diagnosis not present

## 2023-08-07 DIAGNOSIS — M109 Gout, unspecified: Secondary | ICD-10-CM | POA: Diagnosis not present

## 2023-08-08 DIAGNOSIS — K080 Exfoliation of teeth due to systemic causes: Secondary | ICD-10-CM | POA: Diagnosis not present

## 2023-08-09 DIAGNOSIS — L570 Actinic keratosis: Secondary | ICD-10-CM | POA: Diagnosis not present

## 2023-08-21 DIAGNOSIS — Z1212 Encounter for screening for malignant neoplasm of rectum: Secondary | ICD-10-CM | POA: Diagnosis not present

## 2023-08-21 DIAGNOSIS — Z1211 Encounter for screening for malignant neoplasm of colon: Secondary | ICD-10-CM | POA: Diagnosis not present

## 2023-08-26 LAB — COLOGUARD: COLOGUARD: NEGATIVE

## 2023-09-27 ENCOUNTER — Other Ambulatory Visit: Payer: Self-pay | Admitting: Cardiovascular Disease

## 2023-12-14 ENCOUNTER — Other Ambulatory Visit: Payer: Self-pay

## 2023-12-14 MED ORDER — ATORVASTATIN CALCIUM 40 MG PO TABS: 40.0000 mg | ORAL_TABLET | Freq: Every day | ORAL | 0 refills | Status: AC

## 2023-12-22 ENCOUNTER — Ambulatory Visit: Attending: Cardiovascular Disease | Admitting: Cardiovascular Disease

## 2023-12-22 ENCOUNTER — Encounter: Payer: Self-pay | Admitting: Cardiovascular Disease

## 2023-12-22 VITALS — BP 124/64 | HR 69 | Ht 70.0 in | Wt 199.6 lb

## 2023-12-22 DIAGNOSIS — I10 Essential (primary) hypertension: Secondary | ICD-10-CM

## 2023-12-22 DIAGNOSIS — E782 Mixed hyperlipidemia: Secondary | ICD-10-CM | POA: Diagnosis not present

## 2023-12-22 DIAGNOSIS — G459 Transient cerebral ischemic attack, unspecified: Secondary | ICD-10-CM | POA: Diagnosis not present

## 2023-12-22 DIAGNOSIS — Z8774 Personal history of (corrected) congenital malformations of heart and circulatory system: Secondary | ICD-10-CM

## 2023-12-22 DIAGNOSIS — I2510 Atherosclerotic heart disease of native coronary artery without angina pectoris: Secondary | ICD-10-CM

## 2023-12-22 DIAGNOSIS — R7303 Prediabetes: Secondary | ICD-10-CM | POA: Diagnosis not present

## 2023-12-22 DIAGNOSIS — Z8673 Personal history of transient ischemic attack (TIA), and cerebral infarction without residual deficits: Secondary | ICD-10-CM

## 2023-12-22 NOTE — Progress Notes (Signed)
 Cardiology Office Note    Date:  12/22/2023   ID:  KARINA LENDERMAN, DOB [birth year], MRN [id number]  PCP:  Gerome Brunet, DO  Cardiologist:   Jerel Balding, MD   Chief Complaint  Patient presents with   Coronary Artery Disease     History of Present Illness:  Sean Welch is a 73 y.o. 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 His wife Rilla is also my patient. Coronary CTA January 2021 showed moderate nonobstructive disease (50% proximal LAD, FFR>0.9), but elevated calcium  score (293, 71st percentile).  He is doing quite well.  He has retired but remains physically active taking care of his yard work and riding a bicycle with electronic assist.  The patient specifically denies any chest pain at rest or with exertion, dyspnea at rest or with exertion, orthopnea, paroxysmal nocturnal dyspnea, syncope, palpitations, focal neurological deficits, intermittent claudication, lower extremity edema, unexplained weight gain, cough, hemoptysis or wheezing.  His most recent lipid profile shows excellent values with an LDL of 60 and HDL of 48, but continues to have borderline hemoglobin A1c at 6.0% consistent with prediabetes.  In the end, his parents moved to [place] , living in their own home next door to his sister.  His mother passed away, but his father continues to be independent and active.  He drives and set up a car portable by himself at age 71.    Past Medical History:  Diagnosis Date   GERD (gastroesophageal reflux disease)    PFO (patent foramen ovale)    repair 02/2005   Stroke [facility])    TIA-no deficits   Systemic hypertension    TIA (transient ischemic attack)     Past Surgical History:  Procedure Laterality Date   CARPOMETACARPEL SUSPENSION PLASTY Left 11/10/2022   Procedure: LEFT THUMB TRAPEZIECTOMY AND  SUSPENSIONPLASTY;  Surgeon: Murrell Drivers, MD;  Location: [facility];  Service: Orthopedics;  Laterality: Left;  90 MIN   NM MYOCAR PERF WALL MOTION  04/01/2008   Normal   PATENT FORAMEN OVALE CLOSURE  02/11/2005   SHOULDER ARTHROSCOPY     TEE WITHOUT CARDIOVERSION  02/11/2005   TENDON TRANSFER Left 11/10/2022   Procedure: TENDON TRANSFER;  Surgeon: Murrell Drivers, MD;  Location:  SURGERY CENTER;  Service: Orthopedics;  Laterality: Left;   US  ECHOCARDIOGRAPHY  10/19/2011   mild concentric LVH,mechanical PFO closure device, MV leaflet valve thickening    Current Medications: Outpatient Medications Prior to Visit  Medication Sig Dispense Refill   aspirin 81 MG tablet Take 81 mg by mouth daily.     atorvastatin  (LIPITOR) 40 MG tablet Take 1 tablet (40 mg total) by mouth daily. 90 tablet 0   Elderberry 500 MG CAPS Take 500 mg by mouth daily at 6 (six) AM.     famotidine (PEPCID) 20 MG tablet Take 20 mg by mouth daily.     HYDROcodone -acetaminophen  (NORCO) 5-325 MG tablet 1-2 tabs po q6 hours prn pain 25 tablet 0   Multiple Vitamin (MULTIVITAMIN) tablet Take 1 tablet by mouth daily.     tadalafil (CIALIS) 5 MG tablet Take 5 mg by mouth daily.  1   tamsulosin (FLOMAX) 0.4 MG CAPS capsule Take 0.4 mg by mouth daily.     Vitamin D-Vitamin K (VITAMIN K2-VITAMIN D3 PO) Take 1 tablet by mouth daily.     colchicine 0.6 MG tablet Take 0.6  mg by mouth daily as needed. (Patient not taking: Reported on 12/22/2023)  2   No facility-administered medications prior to visit.     Allergies:   Patient has no known allergies.   Family History:  The patient's family history includes Cancer in his mother; Healthy in his child, child, father, sister, and sister; Heart attack in his mother; Hypertension in his mother.   ROS:   Please see the history of present illness.    ROS All other systems are reviewed and are negative.   PHYSICAL EXAM:   VS:  BP 124/64 (BP Location: Left Arm, Patient  Position: Sitting, Cuff Size: Normal)   Pulse 69   Ht 5' 10 (1.778 m)   Wt 199 lb 9.6 oz (90.5 kg)   SpO2 98%   BMI 28.64 kg/m       General: Alert, oriented x3, no distress, overweight, but appears very fit Head: no evidence of trauma, PERRL, EOMI, no exophtalmos or lid lag, no myxedema, no xanthelasma; normal ears, nose and oropharynx Neck: normal jugular venous pulsations and no hepatojugular reflux; brisk carotid pulses without delay and no carotid bruits Chest: clear to auscultation, no signs of consolidation by percussion or palpation, normal fremitus, symmetrical and full respiratory excursions Cardiovascular: normal position and quality of the apical impulse, regular rhythm, normal first and second heart sounds, no murmurs, rubs or gallops Abdomen: no tenderness or distention, no masses by palpation, no abnormal pulsatility or arterial bruits, normal bowel sounds, no hepatosplenomegaly Extremities: no clubbing, cyanosis or edema; 2+ radial, ulnar and brachial pulses bilaterally; 2+ right femoral, posterior tibial and dorsalis pedis pulses; 2+ left femoral, posterior tibial and dorsalis pedis pulses; no subclavian or femoral bruits Neurological: grossly nonfocal Psych: Normal mood and affect    Wt Readings from Last 3 Encounters:  12/22/23 199 lb 9.6 oz (90.5 kg)  11/10/22 208 lb 15.9 oz (94.8 kg)  09/05/22 211 lb (95.7 kg)      Studies/Labs Reviewed:   ECHO 04/16/2018:  1. The left ventricle has normal systolic function of 55-60%. The cavity size was normal. There is no increased left ventricular wall thickness. Left ventricular diastolic parameters were indeterminate.  2. No evidence of left ventricular regional wall motion abnormalities.  3. The right ventricle has normal systolic function. The cavity was normal. There is no increase in right ventricular wall thickness.  4. Septal closure device is present. There is no evidence of shunt by color Doppler. Saline contrast  study was not performed.  Coronary CT Angio 04/05/2019: 1. Coronary calcium  score of 293. This was 71st percentile for age and sex matched control.   2. Normal coronary origin with right dominance.   3. Calcified and non calcified plaque in the proximal LAD causing moderate (50%) stenosis.   4. CAD-RADS 3. Moderate stenosis. Consider symptom-guided anti-ischemic pharmacotherapy as well as risk factor modification per guideline directed care. Additional analysis with CT FFR will be submitted and reported separately. FINDINGS: FFRct analysis was performed on the original cardiac CT angiogram dataset. Diagrammatic representation of the FFRct analysis is provided in a separate PDF document in PACS. This dictation was created using the PDF document and an interactive 3D model of the results. 3D model is not available in the EMR/PACS. Normal FFR range is >0.80.   1. Left Main:  No significant stenosis.   2. LAD: No significant stenosis. FFR>0.9 in the proxima and mid segments,0.88 in the distal segment 3. LCX: No significant stenosis. FFR >0.9 in the proximal  segment, 0.85 in the LCx distal segment 4. RCA: No significant stenosis.  FFR 0.92   IMPRESSION: 1.  CT FFR analysis didn't show any significant stenosis.   EKG:    EKG Interpretation Date/Time:  Friday December 22 2023 09:46:06 EDT Ventricular Rate:  69 PR Interval:  180 QRS Duration:  98 QT Interval:  400 QTC Calculation: 428 R Axis:   -56  Text Interpretation: Normal sinus rhythm Left anterior fascicular block Possible Lateral infarct , age undetermined When compared with ECG of 05-Sep-2022 15:44, No significant change since last tracing Confirmed by Alen Matheson [phone]) on 12/22/2023 10:14:00 AM         Recent Labs: No results found for requested labs within last 365 days.  07/25/2023 Cholesterol 127, HDL 48, LDL 60, triglycerides 101 Hemoglobin A1c 6.0%  Lipid Panel    Component Value Date/Time   CHOL   03/24/2008 0432    182        ATP III CLASSIFICATION:  <200     mg/dL   Desirable  799-760  mg/dL   Borderline High  >=759    mg/dL   High          TRIG 878 03/24/2008 0432   HDL 36 (L) 03/24/2008 0432   CHOLHDL 5.1 03/24/2008 0432   VLDL 24 03/24/2008 0432   LDLCALC (H) 03/24/2008 0432    122        Total Cholesterol/HDL:CHD Risk Coronary Heart Disease Risk Table                     Men   Women  1/2 Average Risk   3.4   3.3  Average Risk       5.0   4.4  2 X Average Risk   9.6   7.1  3 X Average Risk  23.4   11.0        Use the calculated Patient Ratio above and the CHD Risk Table to determine the patient's CHD Risk.        ATP III CLASSIFICATION (LDL):  <100     mg/dL   Optimal  899-870  mg/dL   Near or Above                    Optimal  130-159  mg/dL   Borderline  839-810  mg/dL   High  >809     mg/dL   Very High   95/79/7977 Cholesterol 130, HDL 52, TG 99, LDL 60 A1c 3.6% Creat 1.15, TSH 3.84  ASSESSMENT:    1. Coronary artery disease involving native coronary artery of native heart without angina pectoris   2. Essential hypertension   3. Mixed hyperlipidemia   4. Prediabetes   5. History of TIA (transient ischemic attack)   6. History of closure of atrial septal defect (ASD) using septal occluder device      PLAN:  In order of problems listed above:  CAD: Asymptomatic.  He had extensive nonobstructive plaque on coronary CT angiography in 2021 (coronary calcium  score 70th percentile). On ASA, beta blocker, statin. HTN: Very well-controlled without medications.  Continue exercise, avoid gaining weight. HLP: All lipid parameters in target range earlier this year.  Continue atorvastatin . Prediabetes/overweight: He has managed to lose a little bit of weight but has not really reached target.  Remains moderately overweight.  He weighed 189 lb at end of high school.  His right knee limits walking, but he can ride his bicycle.  Continue to avoid intake of sweets  and starchy foods. ASD closure: Asymptomatic, no abnormalities on physical exam.  No evidence for residual shunt on last echo, but saline contrast was not administered.    Medication Adjustments/Labs and Tests Ordered: Current medicines are reviewed at length with the patient today.  Concerns regarding medicines are outlined above.  Medication changes, Labs and Tests ordered today are listed in the Patient Instructions below. Patient Instructions  Medication Instructions:  No changes *If you need a refill on your cardiac medications before your next appointment, please call your pharmacy*  Lab Work: None ordered If you have labs (blood work) drawn today and your tests are completely normal, you will receive your results only by: MyChart Message (if you have MyChart) OR A paper copy in the mail If you have any lab test that is abnormal or we need to change your treatment, we will call you to review the results.  Testing/Procedures: None ordered  Follow-Up: At [facility], you and your health needs are our priority.  As part of our continuing mission to provide you with exceptional heart care, our providers are all part of one team.  This team includes your primary Cardiologist (physician) and Advanced Practice Providers or APPs (Physician Assistants and Nurse Practitioners) who all work together to provide you with the care you need, when you need it.  Your next appointment:   1 year(s)  Provider:   Jerel Balding, MD    We recommend signing up for the patient portal called MyChart.  Sign up information is provided on this After Visit Summary.  MyChart is used to connect with patients for Virtual Visits (Telemedicine).  Patients are able to view lab/test results, encounter notes, upcoming appointments, etc.  Non-urgent messages can be sent to your provider as well.   To learn more about what you can do with MyChart, go to [email or web address].       Signed, Jerel Balding, MD  12/22/2023 4:11 PM    [facility] [address] [place], [place], [place]  [address] Phone: [phone]; Fax: [phone]

## 2023-12-22 NOTE — Patient Instructions (Signed)

## 2024-02-22 DIAGNOSIS — L570 Actinic keratosis: Secondary | ICD-10-CM | POA: Diagnosis not present

## 2024-02-22 DIAGNOSIS — L820 Inflamed seborrheic keratosis: Secondary | ICD-10-CM | POA: Diagnosis not present

## 2024-04-11 ENCOUNTER — Other Ambulatory Visit: Payer: Self-pay | Admitting: Cardiovascular Disease
